# Patient Record
Sex: Male | Born: 1945 | Race: Black or African American | Hispanic: No | Smoking: Never smoker
Health system: Southern US, Community
[De-identification: ages and names within clinical notes are randomized; demographics above are authoritative.]

## PROBLEM LIST (undated history)

## (undated) DIAGNOSIS — Z77090 Contact with and (suspected) exposure to asbestos: Secondary | ICD-10-CM

## (undated) DIAGNOSIS — N529 Male erectile dysfunction, unspecified: Secondary | ICD-10-CM

## (undated) DIAGNOSIS — N400 Enlarged prostate without lower urinary tract symptoms: Secondary | ICD-10-CM

## (undated) DIAGNOSIS — C61 Malignant neoplasm of prostate: Secondary | ICD-10-CM

## (undated) DIAGNOSIS — Z77098 Contact with and (suspected) exposure to other hazardous, chiefly nonmedicinal, chemicals: Secondary | ICD-10-CM

## (undated) DIAGNOSIS — K4091 Unilateral inguinal hernia, without obstruction or gangrene, recurrent: Secondary | ICD-10-CM

## (undated) DIAGNOSIS — E7800 Pure hypercholesterolemia, unspecified: Secondary | ICD-10-CM

## (undated) DIAGNOSIS — Z8739 Personal history of other diseases of the musculoskeletal system and connective tissue: Secondary | ICD-10-CM

## (undated) DIAGNOSIS — Z7739 Contact with and (suspected) exposure to other war theater: Secondary | ICD-10-CM

## (undated) HISTORY — DX: Malignant neoplasm of prostate: C61

## (undated) HISTORY — DX: Pure hypercholesterolemia, unspecified: E78.00

## (undated) HISTORY — DX: Male erectile dysfunction, unspecified: N52.9

## (undated) HISTORY — DX: Benign prostatic hyperplasia without lower urinary tract symptoms: N40.0

## (undated) HISTORY — DX: Personal history of other diseases of the musculoskeletal system and connective tissue: Z87.39

## (undated) HISTORY — DX: Contact with and (suspected) exposure to other hazardous, chiefly nonmedicinal, chemicals: Z77.098

## (undated) HISTORY — DX: Contact with and (suspected) exposure to other war theater: Z77.39

## (undated) HISTORY — DX: Unilateral inguinal hernia, without obstruction or gangrene, recurrent: K40.91

## (undated) HISTORY — DX: Contact with and (suspected) exposure to asbestos: Z77.090

---

## 2000-04-10 ENCOUNTER — Ambulatory Visit (HOSPITAL_COMMUNITY): Admission: RE | Admit: 2000-04-10 | Discharge: 2000-04-10 | Payer: Self-pay | Admitting: *Deleted

## 2001-06-16 ENCOUNTER — Encounter (INDEPENDENT_AMBULATORY_CARE_PROVIDER_SITE_OTHER): Payer: Self-pay

## 2001-06-16 ENCOUNTER — Other Ambulatory Visit: Admission: RE | Admit: 2001-06-16 | Discharge: 2001-06-16 | Payer: Self-pay | Admitting: Urology

## 2004-11-08 ENCOUNTER — Ambulatory Visit (HOSPITAL_COMMUNITY): Admission: RE | Admit: 2004-11-08 | Discharge: 2004-11-08 | Payer: Self-pay | Admitting: Internal Medicine

## 2007-05-31 ENCOUNTER — Ambulatory Visit (HOSPITAL_COMMUNITY): Admission: RE | Admit: 2007-05-31 | Discharge: 2007-05-31 | Payer: Self-pay | Admitting: *Deleted

## 2011-01-07 NOTE — Op Note (Signed)
Ryan Pope, Ryan Pope              ACCOUNT NO.:  [id number]   MEDICAL RECORD NO.:  [id number]          PATIENT TYPE:  AMB   LOCATION:  ENDO                         FACILITY:  [facility]   PHYSICIAN:  Georgiana Spinner, M.D.    DATE OF BIRTH:  10-02-1945   DATE OF PROCEDURE:  05/31/2007  DATE OF DISCHARGE:                               OPERATIVE REPORT   Dr. working Karleen Hampshire W first turned AVMs [id number].   PROCEDURE:  Colonoscopy.   INDICATIONS:  Colon polyps.   ANESTHESIA:  Demerol 100 mg, Versed 10 mg.   PROCEDURE:  With the patient mildly sedated in the left lateral  decubitus position, a rectal exam was performed which was unremarkable  to my examination.  Subsequently the Pentax videoscopic colonoscope was  inserted the rectum and passed under direct vision to cecum identified  by ileocecal valve and appendiceal orifice both of which were  photographed.  From this point the colonoscope was slowly withdrawn  taking circumferential views of colonic mucosa stopping only in the  rectum which appeared normal on direct and showed hemorrhoids on  retroflexed view.  The endoscope was straightened and withdrawn.  The  patient's vital signs, pulse oximeter remained stable.  The patient  tolerated procedure well without apparent complications.   FINDINGS:  Some diverticulosis of sigmoid colon.  Internal hemorrhoids,  otherwise unremarkable exam.   PLAN:  Repeat examination five years.           ______________________________  Georgiana Spinner, M.D.     GMO/MEDQ  D:  05/31/2007  T:  05/31/2007  Job:  [id number]

## 2011-01-10 NOTE — Procedures (Signed)
[facility]  Patient:    Ryan Pope, Ryan Pope                     MRN: [id number] Proc. Date: 04/10/00 Adm. Date:  [id number] Attending:  Sabino Gasser                           Procedure Report  PROCEDURE:  Colonoscopy.  INDICATIONS FOR PROCEDURE:  Follow-up of colon polyp significantly found 3.5 years ago.  ANESTHESIA:  Demerol 70 mg, Versed 5 mg was given intravenously in divided dose.  DESCRIPTION OF PROCEDURE:  With the patient mildly sedated in the left lateral decubitus position, the Olympus videoscopic colonoscope was inserted in the rectum after a normal rectal exam and passed under direct vision to the cecum. The cecum was identified by the ileocecal valve and appendiceal orifice both of which were photographed. We entered into the terminal ileum which also appeared normal and was photographed. From this point, the colonoscope was slowly withdrawn taking circumferential views of the entire colonic mucosa, stopping only in sigmoid colon where some rare diverticula were seen and photographed. This was done until we reached the rectum which appeared normal on direct and retroflexed view. The endoscope was straightened and withdrawn. The patients vital signs and pulse oximeter remained stable.  The patient tolerated the procedure well without apparent complications.  FINDINGS:  Rare diverticulum of the sigmoid colon, otherwise unremarkable colonoscopic examination.  PLAN:  Repeat examination in 5 years. DD:  04/10/00 TD:  04/11/00 Job: [id number]

## 2011-07-08 ENCOUNTER — Other Ambulatory Visit: Payer: Self-pay | Admitting: Internal Medicine

## 2011-07-08 DIAGNOSIS — Z136 Encounter for screening for cardiovascular disorders: Secondary | ICD-10-CM

## 2011-07-10 ENCOUNTER — Ambulatory Visit
Admission: RE | Admit: 2011-07-10 | Discharge: 2011-07-10 | Disposition: A | Discharge status: Home / Self Care | Payer: Medicare Other | Source: Ambulatory Visit | Attending: Internal Medicine | Admitting: Internal Medicine

## 2011-07-10 ENCOUNTER — Ambulatory Visit: Payer: Self-pay

## 2011-07-10 DIAGNOSIS — Z136 Encounter for screening for cardiovascular disorders: Secondary | ICD-10-CM

## 2014-09-20 DIAGNOSIS — F39 Unspecified mood [affective] disorder: Secondary | ICD-10-CM | POA: Diagnosis not present

## 2014-11-01 DIAGNOSIS — R972 Elevated prostate specific antigen [PSA]: Secondary | ICD-10-CM | POA: Diagnosis not present

## 2014-11-08 DIAGNOSIS — N5201 Erectile dysfunction due to arterial insufficiency: Secondary | ICD-10-CM | POA: Diagnosis not present

## 2014-11-08 DIAGNOSIS — R972 Elevated prostate specific antigen [PSA]: Secondary | ICD-10-CM | POA: Diagnosis not present

## 2014-11-08 DIAGNOSIS — R351 Nocturia: Secondary | ICD-10-CM | POA: Diagnosis not present

## 2014-11-08 DIAGNOSIS — C61 Malignant neoplasm of prostate: Secondary | ICD-10-CM | POA: Diagnosis not present

## 2015-02-05 DIAGNOSIS — C61 Malignant neoplasm of prostate: Secondary | ICD-10-CM | POA: Diagnosis not present

## 2015-02-12 DIAGNOSIS — N400 Enlarged prostate without lower urinary tract symptoms: Secondary | ICD-10-CM | POA: Diagnosis not present

## 2015-02-12 DIAGNOSIS — C61 Malignant neoplasm of prostate: Secondary | ICD-10-CM | POA: Diagnosis not present

## 2015-02-12 DIAGNOSIS — R351 Nocturia: Secondary | ICD-10-CM | POA: Diagnosis not present

## 2015-02-12 DIAGNOSIS — K4090 Unilateral inguinal hernia, without obstruction or gangrene, not specified as recurrent: Secondary | ICD-10-CM | POA: Diagnosis not present

## 2015-04-09 DIAGNOSIS — R972 Elevated prostate specific antigen [PSA]: Secondary | ICD-10-CM | POA: Diagnosis not present

## 2015-04-09 DIAGNOSIS — C61 Malignant neoplasm of prostate: Secondary | ICD-10-CM | POA: Diagnosis not present

## 2015-09-05 DIAGNOSIS — Z0000 Encounter for general adult medical examination without abnormal findings: Secondary | ICD-10-CM | POA: Diagnosis not present

## 2015-09-05 DIAGNOSIS — Z23 Encounter for immunization: Secondary | ICD-10-CM | POA: Diagnosis not present

## 2015-09-05 DIAGNOSIS — Z125 Encounter for screening for malignant neoplasm of prostate: Secondary | ICD-10-CM | POA: Diagnosis not present

## 2015-09-05 DIAGNOSIS — E7800 Pure hypercholesterolemia, unspecified: Secondary | ICD-10-CM | POA: Diagnosis not present

## 2015-09-11 DIAGNOSIS — D72819 Decreased white blood cell count, unspecified: Secondary | ICD-10-CM | POA: Diagnosis not present

## 2015-09-11 DIAGNOSIS — D696 Thrombocytopenia, unspecified: Secondary | ICD-10-CM | POA: Diagnosis not present

## 2015-09-11 DIAGNOSIS — C61 Malignant neoplasm of prostate: Secondary | ICD-10-CM | POA: Diagnosis not present

## 2015-09-11 DIAGNOSIS — R413 Other amnesia: Secondary | ICD-10-CM | POA: Diagnosis not present

## 2015-09-11 DIAGNOSIS — Z23 Encounter for immunization: Secondary | ICD-10-CM | POA: Diagnosis not present

## 2015-09-11 DIAGNOSIS — R634 Abnormal weight loss: Secondary | ICD-10-CM | POA: Diagnosis not present

## 2015-09-25 ENCOUNTER — Encounter: Payer: Self-pay | Admitting: Neurology

## 2015-09-25 ENCOUNTER — Ambulatory Visit (INDEPENDENT_AMBULATORY_CARE_PROVIDER_SITE_OTHER): Payer: Medicare Other | Admitting: Neurology

## 2015-09-25 ENCOUNTER — Other Ambulatory Visit: Payer: Self-pay | Admitting: Neurology

## 2015-09-25 VITALS — BP 122/64 | HR 72 | Resp 16 | Ht 67.0 in | Wt 147.0 lb

## 2015-09-25 DIAGNOSIS — F0390 Unspecified dementia without behavioral disturbance: Secondary | ICD-10-CM

## 2015-09-25 NOTE — Patient Instructions (Signed)
You have complaints of memory loss: memory loss or changes in cognitive function can have many reasons and does not always mean you have dementia. Conditions that can contribute to subjective or objective memory loss include: depression, stress, poor sleep from insomnia or sleep apnea, dehydration, fluctuation in blood sugar values, thyroid or electrolyte dysfunction and certain vitamin deficiencies. Dementia can be caused by stroke, brain atherosclerosis or brain vascular disease due to vascular risk factors (smoking, high blood pressure, high cholesterol, obesity and uncontrolled diabetes), certain degenerative brain disorders (including Parkinson's disease and Multiple sclerosis) and by Alzheimer's disease or other, more rare and sometimes hereditary causes. We will do some additional testing: blood work (which has been done recently already, so we will request results) and we will do a brain scan. We will not start medication as yet, but we may consider medicine soon. We will also request a formal cognitive test called neuropsychological evaluation which is done by a licensed neuropsychologist. We will make a referral in that regard. We will call you with brain scan test results and monitor your symptoms.   As far as your driving, please stick with local roads and daylight driving and avoid highways. Have one of your children observe your driving please.   Drink more water. Exercise regularly.

## 2015-09-25 NOTE — Progress Notes (Addendum)
Subjective:    Patient ID: Ryan Pope is a 70 y.o. male.  HPI     Star Age, MD, PhD [facility] [address] [place], [place] [address]  Dear Dr. Shelia Media,   I saw your patient, Ryan Pope, upon your kind request, in my neurologic clinic today for initial consultation of his memory loss. The patient is accompanied by his daughter today. As you know, Ryan Pope is a 70 year old right-handed gentleman with an underlying medical history of hyperlipidemia, prostate cancer, low back pain, cataracts, thrombocytopenia, weight loss, hearing loss, diverticulosis, gastric ulcer, osteoarthritis, BPH and impaired fasting glucose, who reports memory loss for the past 12 months with mild progression noted.  I reviewed your office note from 09/11/2015, which you kindly included. He is widowed. He lives alone, has lived alone, since his daughter moved out in 52. His wife died of breast cancer in 31. He has 3 sons and 1 daughter his oldest son and middle son living [place], his daughter lives locally as well, his youngest son lives in [place]. You ordered a B12 level and TSH at the time. His MMSE was 27 out of 30 in your office. We will request test results. He has been driving but has had some issues lately with getting lost even on familiar roads. He denies any mood disorder, including hallucinations, delusions, sleep problems, anxiety. He has no overt family history of dementia with the exception of a sister age 40 with memory loss. He has 1 brother and 3 sisters. His mother died at 67 with lung cancer, she was a smoker. His father lived to be in his 29s. He [place] and his parents separated when he was 70 years old. He drinks alcohol occasionally, not daily, and has never been a heavy drinker. He has never been a smoker. 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 He has a high school education.  He has not had any problems with his finances. He reports sleeping well at night but does not keep a very good schedule, often watches TV late at night. His rise time is usually around 7:30 AM.  He was on medication for his prostate cancer but per his daughter's report this was stopped because of potential cognitive side effects. He denies any TIA like symptoms or stroke in the past.  09/25/2015: 2:22 PM: I received blood test results from your office, I reviewed blood work from 09/05/2015: CBC without differential was unremarkable, CMP from 09/05/2015 was unremarkable, CK level CXX, lipid panel showed total cholesterol of 185, HDL 57, LDL 113, triglycerides 77, urinalysis negative, PSA elevated at 8.4. Blood test from 09/11/2015 showed normal B12 at 780, TSH normal at 3.66.  His Past Medical History Is Significant For: Past Medical History  Diagnosis Date  . Prostate cancer ([place])   . Hypercholesteremia   . ED (erectile dysfunction)   . BPH (benign prostatic hyperplasia)   . Asbestos exposure   . History of low back pain   . History of agent Orange exposure   . Recurrent inguinal hernia     His Past Surgical History Is Significant For: No past surgical history on file.  His Family History Is Significant For: Family History  Problem Relation Age of Onset  . Lung cancer Mother   . Prostate cancer Maternal Uncle     His Social History Is Significant For: Social History   Social History  .  Marital Status: Widowed    Spouse Name: N/A  . Number of Children: 4  . Years of Education: HS   Occupational History  . Retired     Social History Main Topics  . Smoking status: Never Smoker   . Smokeless tobacco: None  . Alcohol Use: No  . Drug Use: No  . Sexual Activity: Not Asked   Other Topics Concern  . None   Social History Narrative   Denies caffeine use     His Allergies Are:  Allergies  Allergen Reactions  . Zoloft [Sertraline Hcl]   :   His Current Medications  Are:  Outpatient Encounter Prescriptions as of 09/25/2015  Medication Sig  . bisacodyl (DULCOLAX) 5 MG EC tablet Take 5 mg by mouth daily as needed for moderate constipation.  . cholecalciferol (VITAMIN D) 1000 units tablet Take 1,000 Units by mouth daily.  . cycloSPORINE (RESTASIS) 0.05 % ophthalmic emulsion 1 drop 2 (two) times daily.  Marland Kitchen loratadine (CLARITIN) 10 MG tablet Take 10 mg by mouth daily.  . Multiple Vitamin (MULTIVITAMIN) capsule Take 1 capsule by mouth daily.  . sildenafil (VIAGRA) 100 MG tablet Take 100 mg by mouth daily as needed for erectile dysfunction. Reported on 09/25/2015   No facility-administered encounter medications on file as of 09/25/2015.  :  Review of Systems:  Out of a complete 14 point review of systems, all are reviewed and negative with the exception of these symptoms as listed below:   Review of Systems  HENT: Positive for hearing loss.   Neurological:       Patient feels like his memory loss has been a slow progression.   Psychiatric/Behavioral: Positive for confusion.    Objective:  Neurologic Exam  Physical Exam Physical Examination:   Filed Vitals:   09/25/15 0858  BP: 122/64  Pulse: 72  Resp: 16   General Examination: The patient is a very pleasant 70 y.o. male in no acute distress. He is calm and cooperative with the exam. He denies Auditory Hallucinations and Visual Hallucinations. He is very well groomed and situated in a chair.   HEENT: Normocephalic, atraumatic, pupils are equal, round and reactive to light and accommodation. Funduscopic exam is normal with sharp disc margins noted. He has mild bilateral cataracts. Extraocular tracking shows no saccadic breakdown without nystagmus noted. Hearing is intact. Face is symmetric with no facial masking and normal facial sensation. There is no lip, neck or jaw tremor. Neck is not rigid with intact passive ROM. There are no carotid bruits on auscultation. Oropharynx exam reveals mild mouth  dryness. No significant airway crowding is noted. Mallampati is class II. Tongue protrudes centrally and palate elevates symmetrically.    Chest: is clear to auscultation without wheezing, rhonchi or crackles noted.  Heart: sounds are regular and normal without murmurs, rubs or gallops noted.   Abdomen: is soft, non-tender and non-distended with normal bowel sounds appreciated on auscultation.  Extremities: There is no pitting edema in the distal lower extremities bilaterally. Pedal pulses are intact.   Skin: is warm and dry with no trophic changes noted.   Musculoskeletal: exam reveals no obvious joint deformities, tenderness or joint swelling or erythema.   Neurologically:  Mental status: The patient is awake and alert, paying good  attention. He is unable to partially provide the history. His daugher, Ryan Pope provides details. He is oriented to: person, time/date, situation, day of week, month of year and year. His memory, attention, language and knowledge are impaired mildly. There  is no aphasia, agnosia, apraxia or anomia. There is a mild degree of bradyphrenia. Speech is mildly hypophonic with no dysarthria noted. Mood is congruent and affect is normal.  On 09/25/2015: His MMSE (Mini-Mental state exam) score is 20/30.  CDT (Clock Drawing Test) score is 3/4.  AFT (Animal Fluency Test) score is 8.  Geriatric Depression Scale Score is 3.    Cranial nerves are as described above under HEENT exam. In addition, shoulder shrug is normal with equal shoulder height noted.  Motor exam: Normal bulk, and strength for age is noted. Tone is not rigid with absence of cogwheeling. There is no tremor or rebound, reflexes are 1-2+ throughout, toes and downgoing. Findings motor skills are intact.   Cerebellar testing shows no dysmetria or intention tremor on finger to nose testing. Heel to shin is unremarkable. There is no truncal or gait ataxia.   Sensory exam is intact to light touch, pinprick,  vibration, temperature sense in the upper and lower extremities.   Gait, station and balance: He stands up from the seated position with no difficulty and  posture is normal, stance is narrow based. Tandem walk is normal. Balance is not impaired. Romberg is negative.   Assessment and Plan:   In summary, Ryan Pope is a very pleasant 70 y.o.-year old male with an underlying medical history of hyperlipidemia, prostate cancer, low back pain, cataracts, thrombocytopenia, weight loss, hearing loss, diverticulosis, gastric ulcer, osteoarthritis, BPH and impaired fasting glucose, who has had memory loss for the past 12 months. 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 He has not very many risk factors for vascular disease no strong family history of Alzheimer's disease as reported.  He had recent blood work and we will request test results. I would like to proceed with a brain MRI without contrast and formal neuropsychological evaluation with a license neuropsychologist. He is in agreement, I will make a referral in that regard and we will call him with his brain MRI results. We talked about the diagnosis of memory loss and dementia, its prognosis and treatment options. Implications of diagnosis explained at length with the patient and caregiver.. We talked about medical treatments and non-pharmacological approaches. We talked about maintaining a healthy lifestyle in general and staying active mentally and physically. I encouraged the patient to eat healthy, exercise daily and keep well hydrated, to keep a scheduled bedtime and wake time routine, to not skip any meals and eat healthy snacks in between meals and to have protein with every meal. I stressed the importance of regular exercise, within of course the patient's own mobility limitations. I encouraged the patient to keep up with current events by reading the news paper or watching the news and to  do word puzzles, or if feasible, to go on [email or web address].   We talked about potentially utilizing memory medication in the near future. For now, we will await test results and I will see him back soon. I answered all her questions today and the patient and his daughter, Ryan Pope, were in agreement with the above outlined plan.    Thank you very much for allowing me to participate in the care of this nice patient. If I can be of any further assistance to you please do not hesitate to call me at [phone].  Sincerely,   Star Age, MD, PhD

## 2015-10-03 ENCOUNTER — Encounter: Payer: Self-pay | Admitting: Neurology

## 2015-10-08 DIAGNOSIS — C61 Malignant neoplasm of prostate: Secondary | ICD-10-CM | POA: Diagnosis not present

## 2015-10-10 ENCOUNTER — Ambulatory Visit
Admission: RE | Admit: 2015-10-10 | Discharge: 2015-10-10 | Disposition: A | Discharge status: Home / Self Care | Payer: 59 | Source: Ambulatory Visit | Attending: Neurology | Admitting: Neurology

## 2015-10-10 DIAGNOSIS — F0390 Unspecified dementia without behavioral disturbance: Secondary | ICD-10-CM | POA: Diagnosis not present

## 2015-10-10 DIAGNOSIS — Z01818 Encounter for other preprocedural examination: Secondary | ICD-10-CM | POA: Diagnosis not present

## 2015-10-11 ENCOUNTER — Telehealth: Payer: Self-pay

## 2015-10-11 NOTE — Telephone Encounter (Signed)
Called pt to discuss MRI results. No answer, left a message asking him to call back.

## 2015-10-11 NOTE — Telephone Encounter (Signed)
Spoke to pt's daughter, Vedia Pereyra, per [place]. I advised pt's daughter of Dr. Guadelupe Sabin result note as detailed in this message. Pt's daughter had no questions or concerns at this time and verbalized understanding.

## 2015-10-11 NOTE — Telephone Encounter (Signed)
Pt returned Kristen's call °

## 2015-10-11 NOTE — Progress Notes (Signed)
Quick Note:  Please call patient regarding the recent brain MRI: The brain scan showed a normal structure of the brain and mild volume loss which we call atrophy. There were changes in the deeper structures of the brain, which we call white matter changes or microvascular changes. These were reported as very mild or minimal in His case. These are tiny white spots, that occur with time and are seen in a variety of conditions, including with normal aging, chronic hypertension, chronic headaches, especially migraine HAs, chronic diabetes, chronic hyperlipidemia. These are not strokes and no mass or lesion was seen which is reassuring. Again, there were no acute findings, such as a stroke, or mass or blood products. No further action is required on this test at this time, other than re-enforcing the importance of good blood pressure control, good cholesterol control, good blood sugar control, and weight management. Please remind patient to keep any upcoming appointments or tests and to call us with any interim questions, concerns, problems or updates. Thanks,  Star Age, MD, PhD    ______

## 2015-10-11 NOTE — Telephone Encounter (Signed)
-----   Message from Star Age, MD sent at 10/11/2015  1:04 PM EST ----- Please call patient regarding the recent brain MRI: The brain scan showed a normal structure of the brain and mild volume loss which we call atrophy. There were changes in the deeper structures of the brain, which we call white matter changes or microvascular changes. These were reported as very mild or minimal in His case. These are tiny white spots, that occur with time and are seen in a variety of conditions, including with normal aging, chronic hypertension, chronic headaches, especially migraine HAs, chronic diabetes, chronic hyperlipidemia. These are not strokes and no mass or lesion was seen which is reassuring. Again, there were no acute findings, such as a stroke, or mass or blood products. No further action is required on this test at this time, other than re-enforcing the importance of good blood pressure control, good cholesterol control, good blood sugar control, and weight management. Please remind patient to keep any upcoming appointments or tests and to call us with any interim questions, concerns, problems or updates. Thanks,  Star Age, MD, PhD

## 2015-11-21 DIAGNOSIS — N400 Enlarged prostate without lower urinary tract symptoms: Secondary | ICD-10-CM | POA: Diagnosis not present

## 2015-11-21 DIAGNOSIS — R972 Elevated prostate specific antigen [PSA]: Secondary | ICD-10-CM | POA: Diagnosis not present

## 2015-11-21 DIAGNOSIS — R32 Unspecified urinary incontinence: Secondary | ICD-10-CM | POA: Diagnosis not present

## 2015-11-21 DIAGNOSIS — Z0000 Encounter for general adult medical examination without abnormal findings: Secondary | ICD-10-CM | POA: Diagnosis not present

## 2015-11-21 DIAGNOSIS — C61 Malignant neoplasm of prostate: Secondary | ICD-10-CM | POA: Diagnosis not present

## 2015-12-06 DIAGNOSIS — Z8546 Personal history of malignant neoplasm of prostate: Secondary | ICD-10-CM | POA: Diagnosis not present

## 2015-12-06 DIAGNOSIS — R634 Abnormal weight loss: Secondary | ICD-10-CM | POA: Diagnosis not present

## 2015-12-06 DIAGNOSIS — R413 Other amnesia: Secondary | ICD-10-CM | POA: Diagnosis not present

## 2015-12-27 ENCOUNTER — Ambulatory Visit: Payer: 59 | Attending: Psychology | Admitting: Psychology

## 2015-12-27 ENCOUNTER — Encounter: Payer: Self-pay | Admitting: Psychology

## 2015-12-27 DIAGNOSIS — F0390 Unspecified dementia without behavioral disturbance: Secondary | ICD-10-CM | POA: Insufficient documentation

## 2015-12-27 NOTE — Progress Notes (Signed)
[facility]  [address]   Telephone [phone] [address] Fax [phone] [place], [place] [address]  Initial Contact Note  Name:  Ryan Pope Date of Birth; 1946/01/08 MRN:  [id number] Date:  12/27/2015  Ryan Pope is an 70 y.o. male who was referred for neuropsychological evaluation by Star Age, MD due to his two year history of progressive memory decline.   A total of 5 hours was spent today reviewing medical records, interviewing (CPT [phone]) Ryan Pope and his daughter Ryan Pope, administering and scoring neurocognitive tests (CPT [phone] & [phone]) and preparing a written report.  Diagnostic Impression: Dementia without behavioral disturbance [F03.90]  There were no concerns expressed or behaviors displayed by Ryan Pope that would require immediate attention.  A full report will follow once the evaluation results have been discussed with Mr. Luecht and his daughter. His next appointment is scheduled for 01/02/16.   Jamey Ripa, Ph.D Licensed Psychologist 12/27/2015

## 2016-01-02 ENCOUNTER — Ambulatory Visit (INDEPENDENT_AMBULATORY_CARE_PROVIDER_SITE_OTHER): Payer: 59 | Admitting: Psychology

## 2016-01-02 DIAGNOSIS — F0390 Unspecified dementia without behavioral disturbance: Secondary | ICD-10-CM

## 2016-01-03 ENCOUNTER — Encounter: Payer: Self-pay | Admitting: Psychology

## 2016-01-03 NOTE — Progress Notes (Signed)
[facility]  [address]   Telephone [phone] [address] Fax [phone] [place], [place] [address]    NEUROPSYCHOLOGICAL EVALUATION  *CONFIDENTIAL* This report should not be released without the consent of the client  Name:   Ryan Pope   Date of Birth:  [birth year] [facility] MR#:  [id number] Date of Evaluation: 12/27/15  Reason for Referral Ryan Pope is a 70 year old right-handed man who was referred by Star Age, MD of [facility] for an evaluation of his cognitive functioning. Ryan Pope has displayed signs of memory loss over the past two years.  A brain MRI on 10/10/15 showed mild anterior temporal atrophy and minimal periventricular and subcortical foci of non-specific gliosis.  Sources of Information Electronic medical records from the [facility] were reviewed. Ryan Pope and his daughter, Ryan Pope, were interviewed.    Chief Complaints Ryan Pope reported first noticing a decline in his memory about two years ago. He was not aware of any changes in his health, medication usage, mood, level of life stress or social situation coincident with the onset of his memory loss. He gave recent examples of forgetting why he entered a room, not keeping up with the date, quickly forgetting what he has just read and taking longer to pay his bills. He did not report any problems performing his basic and complex activities of daily living. His daughter agreed that his memory has declined over the past two years. She reported that he has sometimes repeated what he had recently said and has often misplaced personal items. His daughter, who has been visiting him about once a week, reported that he continues to live alone without difficulty. He rarely cooks. He reported that he has been able to remember to take his one medication by routine every morning. Neither he nor his daughter was aware of him having problems with bill  paying or financial management. He reported that he has been driving only to local and familiar destinations without difficulty. His daughter stated her belief that he is a competent driver based on riding with him about once a week.   His only other complaint was intermittent left shoulder pain. He denied problems with gait, eye-hand coordination, balance, limb strength, sleep, daytime alertness, vision, swallowing, speech, appetite, smell or taste.   He denied experiencing emotional distress other than worry about his memory. He specifically denied sad mood, apathy, anhedonia, loss of interests, feelings of worthlessness or hopelessness, passive or active suicidal thoughts, mania, hallucinations and delusions. He did not report any ongoing stressors or recent negative life events. His daughter has not observed any recent changes in his mood, personality or habits. He has not exhibited confusion, wandering, bizarre behavior, poor judgment, loss of social comportment or unsafe behavior.  Background Ryan Pope has lived alone since his daughter moved out in 13. His wife died in 48. He has three sons and one daughter, three of whom live locally.  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  He reported that he was graduated from high school as a good Ship broker without attentional or learning problems. He attended one year at [employer] but dropped out to join [employer].  He served two years in the [place] during the [place] War in a support role without combat experience.  His past medical history was notable for benign prostatic hyperplasia, hearing loss, hyperlipidemia, osteoarthritis  and prostate cancer.   His current medications include loratadine, sildenafil as needed and vitamin supplements.  He did not report history of unusual childhood illnesses or developmental delays. He reported no history of head  injury, loss of consciousness, seizure activity or stroke-like symptoms. He reported that he was exposed to [employer] while serving in the [place] [employer].   He reported occasional alcohol use without past history of abuse. He reported that he has never used illicit drugs or tobacco products.   He was not aware of any family history of memory disorder or dementia.   He reported no history of serious emotional difficulties, use of psychiatric medications or mental health contacts.  Observations He appeared as a slender, appropriately dressed and groomed man in no apparent distress. He was pleasant with a soft-spoken manner. He maintained good eye contact and responded to all questions. No problems were evident for speech articulation, prosody, word finding, word selection, message coherence or language comprehension. His affect appeared within a wide range and without lability. He did not display signs of emotional distress. He had difficulty recalling when in time some past life events had occurred (e.g., year he retired). His thought processes were coherent and organized without loose associations, verbal perseverations or flight of ideas. His thought content was devoid of unusual or bizarre ideas.  Evaluation Procedures In addition to review of medical records and interviews, the following tests or questionnaires were administered:  Animal Naming Test [employer] test Clock Drawing Test      Controlled Oral Word Association Test Geriatric Depression Scale (short form)      Trail Making A & B     Rey Complex Figure: copy Wechsler Adult Intelligence Scale-IV:  Music therapist, Coding, Digit Span, Similarities      Wechsler Memory Scale- IV: Older Adult battery & Symbol Span      Wide Range Achievement Test-4: word reading  Assessment Results Test results were considered to be a valid representation of his current cognitive functioning. He appeared alert and focused. He did not report or  display problems with vision (he wore his eyeglasses), hearing or motor control. He had no apparent problems understanding task instructions. He seemed to put forth optimal effort.   His test scores were corrected to reflect norms for his age and, whenever possible, his gender, race (i.e., African-American) and educational level (i.e., 13 years). A list of his test scores can be found at the end of this report.  Overall, neuropsychological testing indicated global and serious cognitive compromise. He was unable to count down from 20 to 1 without error, copy a spatially complex design, perform a visual-motor test requiring mental tracking and set shifting or repeat the months of the year backwards. He performed within the Severely impaired range (i.e. <1st percentile) on measures of visual processing speed, auditory working memory, immediate memory, delayed memory and visual-spatial assembly. He performed within the Moderately to Severely impaired range (i.e., 1st - 2nd percentile) on tests of visual scanning/simple sequencing and phonemic fluency. He performed within the Borderline to Mildly impaired range (i.e., 2nd - 8th percentile) on tests of category fluency, confrontational naming, constructional praxis (i.e., clock drawing) and abstract verbal reasoning. His only scores within the Normal range were on measures of immediate auditory attention span and word reading.    His score of 1/15 on the Geriatric Depression Scale (short form) did not indicate depression.  Summary Jeraldo Vivier is a 70 year-old man with an approximate two year history of  progressive memory difficulties. Neuropsychological testing indicated serious cognitive compromise across multiple cognitive domains. There were no indications of psychiatric disorder or behavioral disturbance.   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 Use of  medication that might possibly slow his cognitive decline could be considered, if medically appropriate.  2. Based on his daughter's input, no changes in his living situation or lifestyle appear necessary at this point in time. He was encouraged to go ahead with his idea of selling two properties and several cars that he owns as a means to simplify his life. Given his cognitive decline, he was advised to include a family member in the sales process.    3. He was advised to nominate someone to be Power of Attorney over his health care and financial decision making.   4. His cognitive and adaptive functioning should be tracked at regular intervals. Assuming that his cognitive decline is of a non-reversible nature, further cognitive assessment could be accomplished at a screening level (e.g., MOCA) as more in-depth neurocognitive testing would likely be of limited clinical value.  The results from this evaluation were discussed with Mr. Orejel and his daughter on 01/02/16.He had good insight into his difficulties on cognitive testing. He wondered whether his exposure to [place] during the [place] War could be a factor in his cognitive decline. His daughter questioned whether his cognitive decline could be related a chemotherapy agent he was taking two years ago for prostate cancer. Both seemed open to the possibility that he has a dementing illness.    I have appreciated the opportunity to evaluate Mr. Honts. Please feel free to contact me with any comments or questions.    ______________________ Jamey Ripa, Ph.D Licensed Psychologist       . ADDENDUM-NEUROPSYCHOLOGICAL TEST RESULTS  Animal Naming Test Score= 8 3rd (adjusted for age, gender, race and educational level)   [place] Naming Test Score=38/60 5th (adjusted for age, gender, race and educational level)   Clock Drawing Test Score=6/10 Two attempts. Best drawing was mildly impaired as he could not figure out how to  draw the hands   Controlled Oral Word Association Test Score= 12 words/0 repetitions 1st  (adjusted for age, gender, race and educational level)   Rey Complex Figure: copy         Unable to complete,  gave up after 37m 25s   Trails A Score=  154s  1e 1st (adjusted for age, gender, race and educational level)  Trails B Score=  N/A Unable to complete   Wechsler Adult Intelligence Scale-IV  Subtest Scaled Score Percentile  Block Design   2 <1st     Similarities   4   2nd    Digit Span  Forward                Backward                Sequencing   3   7   6    2    1st           16th    9th     <1st      Coding     2 <1st      Wechsler Memory Scale-IV Older Adult Battery Index Index Score Percentile  Immediate Memory   49 <1st      Auditory Memory   53 <1st      Visual Memory   40 <  1st       Delayed Memory     49 <1st      Symbol Span SS=1 <1st       Wide Range Achievement Test-4 Subtest  Raw score Standard score Percentile  Word Reading 53/70     91 27th

## 2016-01-22 ENCOUNTER — Ambulatory Visit: Payer: Medicare Other | Admitting: Neurology

## 2016-05-19 DIAGNOSIS — K4090 Unilateral inguinal hernia, without obstruction or gangrene, not specified as recurrent: Secondary | ICD-10-CM | POA: Diagnosis not present

## 2016-05-19 DIAGNOSIS — Z23 Encounter for immunization: Secondary | ICD-10-CM | POA: Diagnosis not present

## 2016-05-28 DIAGNOSIS — R972 Elevated prostate specific antigen [PSA]: Secondary | ICD-10-CM | POA: Diagnosis not present

## 2016-06-04 DIAGNOSIS — C61 Malignant neoplasm of prostate: Secondary | ICD-10-CM | POA: Diagnosis not present

## 2016-06-04 DIAGNOSIS — R972 Elevated prostate specific antigen [PSA]: Secondary | ICD-10-CM | POA: Diagnosis not present

## 2016-06-25 DIAGNOSIS — K4020 Bilateral inguinal hernia, without obstruction or gangrene, not specified as recurrent: Secondary | ICD-10-CM | POA: Diagnosis not present

## 2016-08-08 DIAGNOSIS — K4020 Bilateral inguinal hernia, without obstruction or gangrene, not specified as recurrent: Secondary | ICD-10-CM | POA: Diagnosis not present

## 2018-09-24 ENCOUNTER — Emergency Department (HOSPITAL_COMMUNITY)
Admission: EM | Admit: 2018-09-24 | Discharge: 2018-09-24 | Disposition: A | Discharge status: Home / Self Care | Payer: Non-veteran care | Attending: Emergency Medicine | Admitting: Emergency Medicine

## 2018-09-24 ENCOUNTER — Other Ambulatory Visit: Payer: Self-pay

## 2018-09-24 DIAGNOSIS — F0390 Unspecified dementia without behavioral disturbance: Secondary | ICD-10-CM | POA: Insufficient documentation

## 2018-09-24 DIAGNOSIS — Z79899 Other long term (current) drug therapy: Secondary | ICD-10-CM | POA: Diagnosis not present

## 2018-09-24 LAB — BASIC METABOLIC PANEL
ANION GAP: 8 (ref 5–15)
BUN: 14 mg/dL (ref 8–23)
CALCIUM: 9 mg/dL (ref 8.9–10.3)
CHLORIDE: 106 mmol/L (ref 98–111)
CO2: 29 mmol/L (ref 22–32)
Creatinine, Ser: 1.22 mg/dL (ref 0.61–1.24)
GFR calc non Af Amer: 58 mL/min — ABNORMAL LOW (ref >60)
Glucose, Bld: 69 mg/dL — ABNORMAL LOW (ref 70–99)
Potassium: 3.9 mmol/L (ref 3.5–5.1)
SODIUM: 143 mmol/L (ref 135–145)

## 2018-09-24 LAB — CBC WITH DIFFERENTIAL/PLATELET
Abs Immature Granulocytes: 0 10*3/uL (ref 0.00–0.07)
BASOS ABS: 0 10*3/uL (ref 0.0–0.1)
Basophils Relative: 1 %
Eosinophils Absolute: 0.1 10*3/uL (ref 0.0–0.5)
Eosinophils Relative: 3 %
HEMATOCRIT: 41.4 % (ref 39.0–52.0)
HEMOGLOBIN: 13.8 g/dL (ref 13.0–17.0)
IMMATURE GRANULOCYTES: 0 %
LYMPHS ABS: 1.3 10*3/uL (ref 0.7–4.0)
LYMPHS PCT: 46 %
MCH: 29.4 pg (ref 26.0–34.0)
MCHC: 33.3 g/dL (ref 30.0–36.0)
MCV: 88.3 fL (ref 80.0–100.0)
Monocytes Absolute: 0.3 10*3/uL (ref 0.1–1.0)
Monocytes Relative: 10 %
NEUTROS ABS: 1.1 10*3/uL — AB (ref 1.7–7.7)
NEUTROS PCT: 40 %
Platelets: 147 10*3/uL — ABNORMAL LOW (ref 150–400)
RBC: 4.69 MIL/uL (ref 4.22–5.81)
RDW: 13.3 % (ref 11.5–15.5)
WBC: 2.9 10*3/uL — AB (ref 4.0–10.5)
nRBC: 0 % (ref 0.0–0.2)

## 2018-09-24 NOTE — ED Provider Notes (Signed)
[name] EMERGENCY DEPARTMENT Provider Note   CSN: [id number] Arrival date & time: 09/24/18  1218     History   Chief Complaint No chief complaint on file.   HPI Ryan Pope is a 73 y.o. male.  The history is provided by the patient and a relative. No language interpreter was used.     73 year old male with history of dementia, prostate cancer, brought in by daughter for evaluation of worsening dementia.  Per daughter, patient was living by himself but with his progressive worsening dementia, a week ago he wandering off from his house, and since then, daughter has been staying with him throughout the day to monitor him while awaiting for her house to be renovated to accommodate him.  Daughter mention she is unable to bathe him appropriately and she is worried that he is not being cared for the way he should because he has not been bathing himself.  Otherwise, patient has been able to perform his normal daily activities.  Daughter brought food for him to eat.  No report of any recent sickness, nausea vomiting diarrhea, any complaints of pain.  She did reach out to the [facility] for further assessments of his condition but was told to come to ER.  At this time patient denies any specific complaint.  Past Medical History:  Diagnosis Date  . Asbestos exposure   . BPH (benign prostatic hyperplasia)   . ED (erectile dysfunction)   . History of agent Orange exposure   . History of low back pain   . Hypercholesteremia   . Prostate cancer ([place])   . Recurrent inguinal hernia     There are no active problems to display for this patient.   The histories are not reviewed yet. Please review them in the "History" navigator section and refresh this [place].      Home Medications    Prior to Admission medications   Medication Sig Start Date End Date Taking? Authorizing Provider  bisacodyl (DULCOLAX) 5 MG EC tablet Take 5 mg by mouth daily as needed for  moderate constipation.    [provider]  cholecalciferol (VITAMIN D) 1000 units tablet Take 1,000 Units by mouth daily.    [provider]  cycloSPORINE (RESTASIS) 0.05 % ophthalmic emulsion 1 drop 2 (two) times daily.    [provider]  loratadine (CLARITIN) 10 MG tablet Take 10 mg by mouth daily.    [provider]  Multiple Vitamin (MULTIVITAMIN) capsule Take 1 capsule by mouth daily.    [provider]  sildenafil (VIAGRA) 100 MG tablet Take 100 mg by mouth daily as needed for erectile dysfunction. Reported on 09/25/2015    [provider]    Family History Family History  Problem Relation Age of Onset  . Lung cancer Mother   . Prostate cancer Maternal Uncle     Social History Social History   Tobacco Use  . Smoking status: Never Smoker  Substance Use Topics  . Alcohol use: No    Alcohol/week: 0.0 standard drinks  . Drug use: No     Allergies   Zoloft [sertraline hcl]   Review of Systems Review of Systems  All other systems reviewed and are negative.    Physical Exam Updated Vital Signs There were no vitals taken for this visit.  Physical Exam Vitals signs and nursing note reviewed.  Constitutional:      General: He is not in acute distress.    Appearance: He  is well-developed.  HENT:     Head: Atraumatic.     Nose: Nose normal.  Eyes:     Conjunctiva/sclera: Conjunctivae normal.  Neck:     Musculoskeletal: Neck supple.  Cardiovascular:     Rate and Rhythm: Normal rate and regular rhythm.  Pulmonary:     Effort: Pulmonary effort is normal.     Breath sounds: Normal breath sounds.  Abdominal:     Palpations: Abdomen is soft.  Musculoskeletal:     Comments: 5 out of 5 strength to all 4 extremities.  Skin:    Findings: No rash.     Comments: No skin rash, no signs of cellulitis signs of skin breakdown noted.  Neurological:     Mental Status: He is alert.     GCS: GCS eye subscore is 4. GCS  verbal subscore is 5. GCS motor subscore is 6.     Cranial Nerves: Cranial nerves are intact.     Sensory: Sensation is intact.     Motor: Motor function is intact.     Coordination: Coordination is intact.     Gait: Gait is intact.     Comments: Alert and oriented to self but not to place, time, or situation.  Psychiatric:        Mood and Affect: Mood normal.      ED Treatments / Results  Labs (all labs ordered are listed, but only abnormal results are displayed) Labs Reviewed  CBC WITH DIFFERENTIAL/PLATELET - Abnormal; Notable for the following components:      Result Value   WBC 2.9 (*)    Platelets 147 (*)    Neutro Abs 1.1 (*)    All other components within normal limits  BASIC METABOLIC PANEL - Abnormal; Notable for the following components:   Glucose, Bld 69 (*)    GFR calc non Af Amer 58 (*)    All other components within normal limits    EKG None  Radiology No results found.  Procedures Procedures (including critical care time)  Medications Ordered in ED Medications - No data to display   Initial Impression / Assessment and Plan / ED Course  I have reviewed the triage vital signs and the nursing notes.  Pertinent labs & imaging results that were available during my care of the patient were reviewed by me and considered in my medical decision making (see chart for details).     BP 126/69   Pulse (!) 53   Temp 97.7 F (36.5 C) (Oral)   Resp 16   SpO2 98%    Final Clinical Impressions(s) / ED Diagnoses   Final diagnoses:  Dementia without behavioral disturbance, unspecified dementia type [facility])    ED Discharge Orders    None     1:28 PM Daughter brought patient here due to his worsening dementia.  She is concerned that he has not bathed in the past week and work like for the staff to bathe patient and to assess for any kind of skin breakdown.  On exam, patient is well-kept, no evidence of skin breakdown.  He does have underlying dementia but  currently in no acute discomfort and answer questions appropriately.  He does have some skin flakes to his feet in which I cleans with a wet towel and soap.  No signs of cellulitis or pressure ulcers.  Will check basic labs.  2:25 PM Labs are reassuring, glucose is 69, patient able to eat and drink.  Family felt comfortable taking patient home.  Recommend outpatient follow-up as needed.  Clean pair of clothes available for patient.  Return precaution discussed.  Care discussed with DR. Curatolo.   Domenic Moras, PA-C 09/24/18 [place], [place], DO 09/24/18 [phone]

## 2018-09-24 NOTE — ED Notes (Signed)
Tech obtaining blood.

## 2018-09-24 NOTE — ED Notes (Signed)
Patient verbalizes understanding of discharge instructions. Opportunity for questioning and answers were provided. Armband removed by staff, pt discharged from ED.  

## 2018-09-24 NOTE — ED Triage Notes (Signed)
Patient arrived from home with daughter. He is unsure why he is here, history of dementia-confused and cooperative.  Daughter did not want to discuss issue in front of patient. She told this RN that she brought her father to the ED because she wanted him to be fully examined to see if he has skin sores becasuse he has not taken a shower in a long time.  Patient had multiple clothing on-skin appears dry and intact

## 2018-10-12 ENCOUNTER — Emergency Department (HOSPITAL_COMMUNITY)
Admission: EM | Admit: 2018-10-12 | Discharge: 2018-10-13 | Disposition: A | Discharge status: Home / Self Care | Payer: No Typology Code available for payment source | Attending: Emergency Medicine | Admitting: Emergency Medicine

## 2018-10-12 DIAGNOSIS — Z0489 Encounter for examination and observation for other specified reasons: Secondary | ICD-10-CM | POA: Diagnosis present

## 2018-10-12 DIAGNOSIS — R451 Restlessness and agitation: Secondary | ICD-10-CM | POA: Diagnosis not present

## 2018-10-12 DIAGNOSIS — F0281 Dementia in other diseases classified elsewhere with behavioral disturbance: Secondary | ICD-10-CM | POA: Diagnosis not present

## 2018-10-12 DIAGNOSIS — F0391 Unspecified dementia with behavioral disturbance: Secondary | ICD-10-CM | POA: Diagnosis not present

## 2018-10-12 DIAGNOSIS — Z79899 Other long term (current) drug therapy: Secondary | ICD-10-CM | POA: Diagnosis not present

## 2018-10-12 DIAGNOSIS — F918 Other conduct disorders: Secondary | ICD-10-CM | POA: Diagnosis not present

## 2018-10-12 DIAGNOSIS — R4689 Other symptoms and signs involving appearance and behavior: Secondary | ICD-10-CM | POA: Diagnosis not present

## 2018-10-12 DIAGNOSIS — G309 Alzheimer's disease, unspecified: Secondary | ICD-10-CM | POA: Insufficient documentation

## 2018-10-12 DIAGNOSIS — F0390 Unspecified dementia without behavioral disturbance: Secondary | ICD-10-CM | POA: Diagnosis not present

## 2018-10-12 DIAGNOSIS — R4182 Altered mental status, unspecified: Secondary | ICD-10-CM | POA: Insufficient documentation

## 2018-10-12 LAB — COMPREHENSIVE METABOLIC PANEL
ALT: 14 U/L (ref 0–44)
AST: 25 U/L (ref 15–41)
Albumin: 4 g/dL (ref 3.5–5.0)
Alkaline Phosphatase: 47 U/L (ref 38–126)
Anion gap: 9 (ref 5–15)
BUN: 21 mg/dL (ref 8–23)
CO2: 27 mmol/L (ref 22–32)
CREATININE: 1.13 mg/dL (ref 0.61–1.24)
Calcium: 9 mg/dL (ref 8.9–10.3)
Chloride: 105 mmol/L (ref 98–111)
GFR calc Af Amer: >60 mL/min (ref >60)
GFR calc non Af Amer: >60 mL/min (ref >60)
Glucose, Bld: 128 mg/dL — ABNORMAL HIGH (ref 70–99)
Potassium: 4.1 mmol/L (ref 3.5–5.1)
Sodium: 141 mmol/L (ref 135–145)
Total Bilirubin: 0.9 mg/dL (ref 0.3–1.2)
Total Protein: 6.9 g/dL (ref 6.5–8.1)

## 2018-10-12 LAB — CBC
HCT: 40.2 % (ref 39.0–52.0)
Hemoglobin: 13.9 g/dL (ref 13.0–17.0)
MCH: 30.6 pg (ref 26.0–34.0)
MCHC: 34.6 g/dL (ref 30.0–36.0)
MCV: 88.5 fL (ref 80.0–100.0)
Platelets: 162 10*3/uL (ref 150–400)
RBC: 4.54 MIL/uL (ref 4.22–5.81)
RDW: 13.5 % (ref 11.5–15.5)
WBC: 3.9 10*3/uL — ABNORMAL LOW (ref 4.0–10.5)
nRBC: 0 % (ref 0.0–0.2)

## 2018-10-12 MED ORDER — SODIUM CHLORIDE 0.9% FLUSH: 3.0000 mL | Freq: Once | INTRAVENOUS | Status: DC

## 2018-10-12 NOTE — ED Triage Notes (Signed)
Pt here with family from home for evaluation of AMS that is worse at nights. Family states that pt has hx of dementia and is more altered at night.  Pt A&Ox2 disoriented to time and situation.  Family states most care is at the [facility].

## 2018-10-13 ENCOUNTER — Emergency Department (HOSPITAL_COMMUNITY): Payer: No Typology Code available for payment source

## 2018-10-13 LAB — URINALYSIS, ROUTINE W REFLEX MICROSCOPIC
BILIRUBIN URINE: NEGATIVE
Glucose, UA: NEGATIVE mg/dL
Hgb urine dipstick: NEGATIVE
KETONES UR: NEGATIVE mg/dL
LEUKOCYTE UA: NEGATIVE
NITRITE: NEGATIVE
Protein, ur: NEGATIVE mg/dL
Specific Gravity, Urine: 1.026 (ref 1.005–1.030)
pH: 5 (ref 5.0–8.0)

## 2018-10-13 LAB — RAPID URINE DRUG SCREEN, HOSP PERFORMED
Amphetamines: NOT DETECTED
BENZODIAZEPINES: NOT DETECTED
Barbiturates: NOT DETECTED
Cocaine: NOT DETECTED
Opiates: NOT DETECTED
Tetrahydrocannabinol: NOT DETECTED

## 2018-10-13 NOTE — ED Notes (Addendum)
TTS counselor advised RN that pt.did not meet criteria for  in- patient behavior heath services but will refer to social worker for skilled nursing care placement / memory care facility .

## 2018-10-13 NOTE — Progress Notes (Signed)
Received a phone call requesting that daughter, Vedia Pereyra, have me contact her regarding her father's disposition.  Attempted to contact daughter at [phone] and no one answered.  Reviewed patient's chart and there was no indication of patient having any suicidal homicidal ideations.  Patient has a history of dementia and it appears that he is disease has progressed as he is attempted to leave the home and is sundowning.  Contacted Dr. Sherry Ruffing and discussed the patient with him and he is also stated that he requested social work and case management to see the patient to assist with potential memory care unit placement.  At this time patient is still cleared from a psychiatric standpoint.

## 2018-10-13 NOTE — BH Assessment (Signed)
Clinician contacted pt's daughter, Emilia Beck, with pt's verbal permission, to get collaboration. The information gathered from pt's daughter can be found in [facility] Assessment. Pt's daughter stated she is pt's Power of [place], though clinician has not confirmed this.  Joellyn Rued, daughter: [phone]

## 2018-10-13 NOTE — Discharge Instructions (Addendum)
It is very important that you follow-up with your primary care doctor to help with placement and assistance. Your work-up today was reassuring. Return to the emergency room with any new, worsening, concerning symptoms.

## 2018-10-13 NOTE — ED Notes (Signed)
Breakfast Tray Ordered. 

## 2018-10-13 NOTE — Progress Notes (Addendum)
13:38: CSW spoke with patients daughter, Vedia Pereyra, via phone- daughter wanting patient to return home at this time. Daughter will continue to stay with patient until long-term arrangements can be made. Patient has PCP through the [facility] [place] and daughter will follow up with them.   CSW spoke with patients daughter, Joellyn Rued [phone], regarding disposition plans. CSW informed Ms. Spinks that patient has been psychiatrically cleared and is stable to be released from [facility] ED. Ms. Myrtie Neither requested to speak with the psychiatrist due to her wanting patient to go to a gero-psych facility. CSW informed Ms. Spinks that assessment had already been completed with recommendations- daughter still requesting to speak with psychiatrist.   CSW will follow up with daughter for continued discharge plans.   Kingsley Spittle, LCSW Clinical Social Worker  [facility] [place]  [phone]

## 2018-10-13 NOTE — BH Assessment (Addendum)
Tele Assessment Note   Patient Name: Ryan Pope MRN: [id number] Referring Physician: Dr. Pryor Curia, DO Location of Patient: [facility] Location of Provider: [place] [facility] is a 73 y.o. male who was brought to [facility] by his family due to ongoing concerns regarding his dementia. Pt's daughter shares she moved in with her father several weeks ago and that, up until that time, she didn't realize how poorly her father was doing. Pt's daughter shares her father will be in his home and begin to request that his daughter take him home; she states that, the more she tries to reason with him, the more agitated he becomes. Pt's daughter states that tonight she tried to do something different, so she called her brother and he drove their father around and eventually took him back home, showing him the number to the house, the home, the driveway, and that for about 10-15 minutes he was appeased, but that after that time he began arguing and demanding to be taken home again, asking why they were trying to trick him. It was at that time that pt's daughter and son agreed pt needed to go back to the hospital; pt had been in the hospital earlier in the week (on Saturday, October 09, 2018) for similar thoughts and behaviors.  Pt shares that things are going well--he denies SI, HI, AVH, and NSSIB. He denies any SA or that he has access to any weapons. Pt denies any involvement in the legal system and states that he has no problems getting around in his home, states that he can walk around fine, and that he can shower and dress himself daily. Pt's daughter states that, while her father is not suicidal, she does believe he is depressed. She states he has begun talking about things that he's never said before, such as that he misses their mother that left the family 33 years ago, leaving pt to raise 4 children by himself. Pt's daughter shared that she was able to get pt to  shower and change clothes two weeks ago but that pt has not changed his clothes since then, leaving her to believe that he has also not showered, which worries her that he might have infections or skin problems. Pt's daughter shares that pt has been getting lost in his own home, entering rooms, such as bathrooms, and not being able to figure out how to get out of them. Pt's daughter shares that her father believes that when he goes to a closet it will lead to something else, such as another building. She shares that her father has also been thinking that people have broken into the home, as he sees people in the mirror when he looks into them, presumably because he doesn't recognize himself. Pt's daughter shares pt keeps knick-knacks in his pockets, such as pens, coins, etc., and that all day and night he'll go through his pockets and look at them and organize them. She shares that he'll accuse people of stealing his things if he thinks anything is missing.  Pt is oriented x2; he is not able to identify the date, despite his birthday being three days ago. Pt does not know where he is (he stated, specifically, that he was not at the hospital), though he does know who he is and that he is in the [place]. Pt's recent and remote memory could not be determined. Pt was pleasant throughout the assessment process, which  pt's daughter shares is a hindrance, as she shares that everyone enjoys her father and his attitude so much that he's able to fool people that nothing's wrong. Pt's insight, judgement, and impulse control is impaired at this time.   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 Asbestos exposure   . BPH (benign prostatic hyperplasia)   . ED (erectile dysfunction)   . History of agent Orange exposure   . History of low back pain   .  Hypercholesteremia   . Prostate cancer ([place])   . Recurrent inguinal hernia      Family History:  Family History  Problem Relation Age of Onset  . Lung cancer Mother   . Prostate cancer Maternal Uncle     Social History:  reports that he has never smoked. He does not have any smokeless tobacco history on file. He reports that he does not drink alcohol or use drugs.  Additional Social History:     CIWA: CIWA-Ar BP: 139/78 Pulse Rate: 66 COWS:    Allergies:  Allergies  Allergen Reactions  . Zoloft [Sertraline Hcl]     Home Medications: (Not in a hospital admission)   OB/GYN Status:  No LMP for male patient.  General Assessment Data Location of Assessment: [facility] TTS Assessment: In system Is this a Tele or Face-to-Face Assessment?: Tele Assessment Is this an Initial Assessment or a Re-assessment for this encounter?: Initial Assessment Admission Status: Voluntary           Risk to self with the past 6 months Suicidal Ideation: No        Mental Status Report Motor Activity: Unremarkable  Cognitive Functioning Appetite: Good Have you had any weight changes? : No Change Sleep: No Change Total Hours of Sleep: (Unknown) Vegetative Symptoms: None  ADLScreening [facility] Assessment Services) Patient able to express need for assistance with ADLs?: Yes Independently performs ADLs?: Yes (appropriate for developmental age)        ADL Screening (condition at time of admission) Is the patient deaf or have difficulty hearing?: No Does the patient have difficulty seeing, even when wearing glasses/contacts?: No Does the patient have difficulty concentrating, remembering, or making decisions?: Yes Patient able to express need for assistance with ADLs?: Yes Does the patient have difficulty dressing or bathing?: No Independently performs ADLs?: Yes (appropriate for developmental age) Does the patient have difficulty walking or climbing stairs?: No Weakness of Legs:  None Weakness of Arms/Hands: None  Home Assistive Devices/Equipment Home Assistive Devices/Equipment: None  Therapy Consults (therapy consults require a physician order) PT Evaluation Needed: No OT Evalulation Needed: No SLP Evaluation Needed: No Abuse/Neglect Assessment (Assessment to be complete while patient is alone) Abuse/Neglect Assessment Can Be Completed: Unable to assess, patient is non-responsive or altered mental status Values / Beliefs Cultural Requests During Hospitalization: None Spiritual Requests During Hospitalization: None Consults Spiritual Care Consult Needed: No Social Work Consult Needed: No         Disposition: Patriciaann Clan, PA, reviewed pt's chart and information and determined pt does not meet criteria for inpatient hospitalization but that he is not safe to return home; [place] recommends a SW consult to assist in identifying placement for pt. This information was provided to pt's nurse, Clydene Laming, at [phone]. The SW order was made by clinician at [phone].    This service was provided via telemedicine using a 2-way, interactive audio  and Radiographer, therapeutic.  Names of all persons participating in this telemedicine service and their role in this encounter. Name: Ryan Pope Role: Patient  Name: Joellyn Rued Role: Patient's Daughter  Name: Windell Hummingbird Role: Clinician    Dannielle Burn 10/13/2018 5:41 AM

## 2018-10-13 NOTE — ED Notes (Addendum)
Patient wanded by security , pt. wearing maroon paper scrubs , sitter at bedside .

## 2018-10-13 NOTE — ED Notes (Signed)
Ordered breakfast 
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
## 2018-10-13 NOTE — ED Notes (Signed)
EDP explained plan of care to pt.'s daughter .

## 2018-10-13 NOTE — Discharge Planning (Signed)
[facility] notified (text) EDSW of daughter arrival.

## 2018-10-13 NOTE — ED Notes (Signed)
TTS video interview in progress , purple scrubs given to pt. , his clothes and personal belongings given to daughter to take home .

## 2018-11-05 DIAGNOSIS — Z111 Encounter for screening for respiratory tuberculosis: Secondary | ICD-10-CM | POA: Diagnosis not present

## 2018-11-08 DIAGNOSIS — Z111 Encounter for screening for respiratory tuberculosis: Secondary | ICD-10-CM | POA: Diagnosis not present

## 2018-12-10 DIAGNOSIS — F0391 Unspecified dementia with behavioral disturbance: Secondary | ICD-10-CM | POA: Diagnosis not present

## 2018-12-10 DIAGNOSIS — F419 Anxiety disorder, unspecified: Secondary | ICD-10-CM | POA: Diagnosis not present

## 2019-01-05 DIAGNOSIS — F0390 Unspecified dementia without behavioral disturbance: Secondary | ICD-10-CM | POA: Diagnosis not present

## 2019-01-05 DIAGNOSIS — F419 Anxiety disorder, unspecified: Secondary | ICD-10-CM | POA: Diagnosis not present

## 2019-01-05 DIAGNOSIS — F5102 Adjustment insomnia: Secondary | ICD-10-CM | POA: Diagnosis not present

## 2019-01-11 DIAGNOSIS — Z79899 Other long term (current) drug therapy: Secondary | ICD-10-CM | POA: Diagnosis not present

## 2019-01-12 ENCOUNTER — Emergency Department (HOSPITAL_COMMUNITY): Payer: Medicare PPO

## 2019-01-12 ENCOUNTER — Other Ambulatory Visit: Payer: Self-pay

## 2019-01-12 ENCOUNTER — Inpatient Hospital Stay (HOSPITAL_COMMUNITY)
Admission: EM | Admit: 2019-01-12 | Discharge: 2019-01-19 | DRG: 177 | Disposition: A | Discharge status: Skilled Nursing Facility | Payer: Medicare PPO | Source: Skilled Nursing Facility | Attending: Internal Medicine | Admitting: Internal Medicine

## 2019-01-12 ENCOUNTER — Encounter (HOSPITAL_COMMUNITY): Payer: Self-pay | Admitting: Emergency Medicine

## 2019-01-12 DIAGNOSIS — Z888 Allergy status to other drugs, medicaments and biological substances status: Secondary | ICD-10-CM

## 2019-01-12 DIAGNOSIS — Z77090 Contact with and (suspected) exposure to asbestos: Secondary | ICD-10-CM | POA: Diagnosis not present

## 2019-01-12 DIAGNOSIS — G9341 Metabolic encephalopathy: Secondary | ICD-10-CM | POA: Diagnosis not present

## 2019-01-12 DIAGNOSIS — Z801 Family history of malignant neoplasm of trachea, bronchus and lung: Secondary | ICD-10-CM

## 2019-01-12 DIAGNOSIS — Z7401 Bed confinement status: Secondary | ICD-10-CM | POA: Diagnosis not present

## 2019-01-12 DIAGNOSIS — E860 Dehydration: Secondary | ICD-10-CM | POA: Diagnosis present

## 2019-01-12 DIAGNOSIS — N400 Enlarged prostate without lower urinary tract symptoms: Secondary | ICD-10-CM | POA: Diagnosis present

## 2019-01-12 DIAGNOSIS — Z8546 Personal history of malignant neoplasm of prostate: Secondary | ICD-10-CM

## 2019-01-12 DIAGNOSIS — K219 Gastro-esophageal reflux disease without esophagitis: Secondary | ICD-10-CM | POA: Diagnosis not present

## 2019-01-12 DIAGNOSIS — R4182 Altered mental status, unspecified: Secondary | ICD-10-CM

## 2019-01-12 DIAGNOSIS — M2550 Pain in unspecified joint: Secondary | ICD-10-CM | POA: Diagnosis not present

## 2019-01-12 DIAGNOSIS — Z209 Contact with and (suspected) exposure to unspecified communicable disease: Secondary | ICD-10-CM | POA: Diagnosis not present

## 2019-01-12 DIAGNOSIS — U071 COVID-19: Principal | ICD-10-CM | POA: Diagnosis present

## 2019-01-12 DIAGNOSIS — R410 Disorientation, unspecified: Secondary | ICD-10-CM | POA: Diagnosis not present

## 2019-01-12 DIAGNOSIS — E7800 Pure hypercholesterolemia, unspecified: Secondary | ICD-10-CM | POA: Diagnosis not present

## 2019-01-12 DIAGNOSIS — R627 Adult failure to thrive: Secondary | ICD-10-CM | POA: Diagnosis present

## 2019-01-12 DIAGNOSIS — F0390 Unspecified dementia without behavioral disturbance: Secondary | ICD-10-CM | POA: Diagnosis not present

## 2019-01-12 DIAGNOSIS — R0902 Hypoxemia: Secondary | ICD-10-CM | POA: Diagnosis not present

## 2019-01-12 DIAGNOSIS — Z8042 Family history of malignant neoplasm of prostate: Secondary | ICD-10-CM

## 2019-01-12 LAB — COMPREHENSIVE METABOLIC PANEL
ALT: 16 U/L (ref 0–44)
AST: 28 U/L (ref 15–41)
Albumin: 4.1 g/dL (ref 3.5–5.0)
Alkaline Phosphatase: 67 U/L (ref 38–126)
Anion gap: 7 (ref 5–15)
BUN: 18 mg/dL (ref 8–23)
CO2: 28 mmol/L (ref 22–32)
Calcium: 8.8 mg/dL — ABNORMAL LOW (ref 8.9–10.3)
Chloride: 104 mmol/L (ref 98–111)
Creatinine, Ser: 1.2 mg/dL (ref 0.61–1.24)
GFR calc Af Amer: >60 mL/min (ref >60)
GFR calc non Af Amer: 59 mL/min — ABNORMAL LOW (ref >60)
Glucose, Bld: 104 mg/dL — ABNORMAL HIGH (ref 70–99)
Potassium: 4.3 mmol/L (ref 3.5–5.1)
Sodium: 139 mmol/L (ref 135–145)
Total Bilirubin: 1 mg/dL (ref 0.3–1.2)
Total Protein: 7.3 g/dL (ref 6.5–8.1)

## 2019-01-12 LAB — CBC WITH DIFFERENTIAL/PLATELET
Abs Immature Granulocytes: 0.01 10*3/uL (ref 0.00–0.07)
Basophils Absolute: 0 10*3/uL (ref 0.0–0.1)
Basophils Relative: 1 %
Eosinophils Absolute: 0.1 10*3/uL (ref 0.0–0.5)
Eosinophils Relative: 2 %
HCT: 40.6 % (ref 39.0–52.0)
Hemoglobin: 13.8 g/dL (ref 13.0–17.0)
Immature Granulocytes: 0 %
Lymphocytes Relative: 42 %
Lymphs Abs: 1.1 10*3/uL (ref 0.7–4.0)
MCH: 30.1 pg (ref 26.0–34.0)
MCHC: 34 g/dL (ref 30.0–36.0)
MCV: 88.5 fL (ref 80.0–100.0)
Monocytes Absolute: 0.3 10*3/uL (ref 0.1–1.0)
Monocytes Relative: 10 %
Neutro Abs: 1.1 10*3/uL — ABNORMAL LOW (ref 1.7–7.7)
Neutrophils Relative %: 45 %
Platelets: 163 10*3/uL (ref 150–400)
RBC: 4.59 MIL/uL (ref 4.22–5.81)
RDW: 12.6 % (ref 11.5–15.5)
WBC: 2.5 10*3/uL — ABNORMAL LOW (ref 4.0–10.5)
nRBC: 0 % (ref 0.0–0.2)

## 2019-01-12 LAB — URINALYSIS, ROUTINE W REFLEX MICROSCOPIC
Bilirubin Urine: NEGATIVE
Glucose, UA: NEGATIVE mg/dL
Hgb urine dipstick: NEGATIVE
Ketones, ur: NEGATIVE mg/dL
Leukocytes,Ua: NEGATIVE
Nitrite: NEGATIVE
Protein, ur: NEGATIVE mg/dL
Specific Gravity, Urine: 1.014 (ref 1.005–1.030)
pH: 7 (ref 5.0–8.0)

## 2019-01-12 LAB — CREATININE, SERUM
Creatinine, Ser: 1.17 mg/dL (ref 0.61–1.24)
GFR calc Af Amer: >60 mL/min (ref >60)
GFR calc non Af Amer: >60 mL/min (ref >60)

## 2019-01-12 LAB — RAPID URINE DRUG SCREEN, HOSP PERFORMED
Amphetamines: NOT DETECTED
Barbiturates: NOT DETECTED
Benzodiazepines: NOT DETECTED
Cocaine: NOT DETECTED
Opiates: NOT DETECTED
Tetrahydrocannabinol: NOT DETECTED

## 2019-01-12 LAB — FERRITIN: Ferritin: 247 ng/mL (ref 24–336)

## 2019-01-12 LAB — CBC
HCT: 41.9 % (ref 39.0–52.0)
Hemoglobin: 14.5 g/dL (ref 13.0–17.0)
MCH: 30.3 pg (ref 26.0–34.0)
MCHC: 34.6 g/dL (ref 30.0–36.0)
MCV: 87.5 fL (ref 80.0–100.0)
Platelets: 174 10*3/uL (ref 150–400)
RBC: 4.79 MIL/uL (ref 4.22–5.81)
RDW: 12.6 % (ref 11.5–15.5)
WBC: 3.4 10*3/uL — ABNORMAL LOW (ref 4.0–10.5)
nRBC: 0 % (ref 0.0–0.2)

## 2019-01-12 LAB — LACTIC ACID, PLASMA: Lactic Acid, Venous: 0.7 mmol/L (ref 0.5–1.9)

## 2019-01-12 LAB — C-REACTIVE PROTEIN: CRP: <0.8 mg/dL (ref <1.0)

## 2019-01-12 LAB — MAGNESIUM: Magnesium: 2.4 mg/dL (ref 1.7–2.4)

## 2019-01-12 LAB — TROPONIN I: Troponin I: <0.03 ng/mL (ref <0.03)

## 2019-01-12 LAB — AMMONIA: Ammonia: <9 umol/L — ABNORMAL LOW (ref 9–35)

## 2019-01-12 LAB — D-DIMER, QUANTITATIVE: D-Dimer, Quant: 0.5 ug/mL-FEU (ref 0.00–0.50)

## 2019-01-12 LAB — SARS CORONAVIRUS 2 BY RT PCR (HOSPITAL ORDER, PERFORMED IN ~~LOC~~ HOSPITAL LAB): SARS Coronavirus 2: POSITIVE — AB

## 2019-01-12 LAB — ABO/RH: ABO/RH(D): B NEG

## 2019-01-12 MED ORDER — ONDANSETRON HCL 4 MG/2ML IJ SOLN: 4.0000 mg | Freq: Four times a day (QID) | INTRAMUSCULAR | Status: DC | PRN

## 2019-01-12 MED ORDER — ACETAMINOPHEN 325 MG PO TABS
650.0000 mg | ORAL_TABLET | Freq: Four times a day (QID) | ORAL | Status: DC | PRN
  Administered 2019-01-14: 650 mg via ORAL
  Filled 2019-01-12: qty 2

## 2019-01-12 MED ORDER — ONDANSETRON HCL 4 MG PO TABS: 4.0000 mg | ORAL_TABLET | Freq: Four times a day (QID) | ORAL | Status: DC | PRN

## 2019-01-12 MED ORDER — SODIUM CHLORIDE 0.9 % IV SOLN
INTRAVENOUS | Status: DC
  Administered 2019-01-12 – 2019-01-13 (×2): via INTRAVENOUS

## 2019-01-12 MED ORDER — ENOXAPARIN SODIUM 40 MG/0.4ML ~~LOC~~ SOLN
40.0000 mg | SUBCUTANEOUS | Status: DC
  Administered 2019-01-12: 40 mg via SUBCUTANEOUS
  Filled 2019-01-12: qty 0.4

## 2019-01-12 MED ORDER — TRAZODONE HCL 50 MG PO TABS
50.0000 mg | ORAL_TABLET | Freq: Every day | ORAL | Status: DC
  Administered 2019-01-12 – 2019-01-18 (×7): 50 mg via ORAL
  Filled 2019-01-12 (×7): qty 1

## 2019-01-12 MED ORDER — CALCIUM CARBONATE ANTACID 500 MG PO CHEW
1.0000 | CHEWABLE_TABLET | Freq: Four times a day (QID) | ORAL | Status: DC | PRN
  Filled 2019-01-12: qty 1

## 2019-01-12 MED ORDER — SENNOSIDES-DOCUSATE SODIUM 8.6-50 MG PO TABS: 1.0000 | ORAL_TABLET | Freq: Every evening | ORAL | Status: DC | PRN

## 2019-01-12 NOTE — ED Notes (Signed)
ED TO INPATIENT HANDOFF REPORT  ED Nurse Name and Phone #: Barrie Dunker [phone]  S Name/Age/Gender Ryan Pope 73 y.o. male Room/Bed: WA20/WA20  Code Status   Code Status: Prior  Home/SNF/Other Skilled nursing facility Patient oriented to: self Is this baseline? Yes   Triage Complete: Triage complete  Chief Complaint dementia  Triage Note Patient arrived by EMS from [facility]. Staff at facility stated that patient had a change of behavior. Staff stated he used to eat and talk. Pt is not not eating, talking, and not arousable. Staff stated this has been happening for the past 2 days. Pt c/o head hurting.   EMS VS BP 128/73, RR 16, SpO2 100% on RA, CBG 121, T 98.1 F, and HR 52.   Hx of Dementia.    Allergies Allergies  Allergen Reactions  . Zoloft [Sertraline Hcl]     Level of Care/Admitting Diagnosis ED Disposition    ED Disposition Condition [place] [facility]: [place] [[id number]]  Level of Care: Med-Surg [16]  Covid Evaluation: Confirmed COVID Positive  Isolation Risk Level: Low Risk/Droplet (Less than 4L [place] supplementation)  Diagnosis: COVID-19 virus infection [[id number]]  Admitting Physician: [place] ERIC J [[id number]]  Attending Physician: [place]IC J [[id number]]  Estimated length of stay: past midnight tomorrow  Certification:: I certify this patient will need inpatient services for at least 2 midnights  PT Class (Do Not Modify): Inpatient [101]  PT Acc Code (Do Not Modify): Private [1]       B Medical/Surgery History Past Medical History:  Diagnosis Date  . Asbestos exposure   . BPH (benign prostatic hyperplasia)   . ED (erectile dysfunction)   . History of agent Orange exposure   . History of low back pain   . Hypercholesteremia   . Prostate cancer ([place])   . Recurrent inguinal hernia    History reviewed. No pertinent surgical history.   A IV Location/Drains/Wounds Patient Lines/Drains/Airways  Status   Active Line/Drains/Airways    Name:   Placement date:   Placement time:   Site:   Days:   Peripheral IV 01/12/19 Left Antecubital   01/12/19    1422    Antecubital   less than 1          Intake/Output Last 24 hours No intake or output data in the 24 hours ending 01/12/19 1424  Labs/Imaging Results for orders placed or performed during the hospital encounter of 01/12/19 (from the past 48 hour(s))  CBC with Differential     Status: Abnormal   Collection Time: 01/12/19  9:58 AM  Result Value Ref Range   WBC 2.5 (L) 4.0 - 10.5 K/uL   RBC 4.59 4.22 - 5.81 MIL/uL   Hemoglobin 13.8 13.0 - 17.0 g/dL   HCT 40.6 39.0 - 52.0 %   MCV 88.5 80.0 - 100.0 fL   MCH 30.1 26.0 - 34.0 pg   MCHC 34.0 30.0 - 36.0 g/dL   RDW 12.6 11.5 - 15.5 %   Platelets 163 150 - 400 K/uL   nRBC 0.0 0.0 - 0.2 %   Neutrophils Relative % 45 %   Neutro Abs 1.1 (L) 1.7 - 7.7 K/uL   Lymphocytes Relative 42 %   Lymphs Abs 1.1 0.7 - 4.0 K/uL   Monocytes Relative 10 %   Monocytes Absolute 0.3 0.1 - 1.0 K/uL   Eosinophils Relative 2 %   Eosinophils Absolute 0.1 0.0 - 0.5 K/uL   Basophils Relative 1 %  Basophils Absolute 0.0 0.0 - 0.1 K/uL   Immature Granulocytes 0 %   Abs Immature Granulocytes 0.01 0.00 - 0.07 K/uL    Comment: Performed at [facility], [place] [address], [place], [place] [address]  Comprehensive metabolic panel     Status: Abnormal   Collection Time: 01/12/19  9:58 AM  Result Value Ref Range   Sodium 139 135 - 145 mmol/L   Potassium 4.3 3.5 - 5.1 mmol/L   Chloride 104 98 - 111 mmol/L   CO2 28 22 - 32 mmol/L   Glucose, Bld 104 (H) 70 - 99 mg/dL   BUN 18 8 - 23 mg/dL   Creatinine, Ser 1.20 0.61 - 1.24 mg/dL   Calcium 8.8 (L) 8.9 - 10.3 mg/dL   Total Protein 7.3 6.5 - 8.1 g/dL   Albumin 4.1 3.5 - 5.0 g/dL   AST 28 15 - 41 U/L   ALT 16 0 - 44 U/L   Alkaline Phosphatase 67 38 - 126 U/L   Total Bilirubin 1.0 0.3 - 1.2 mg/dL   GFR calc non Af Amer 59 (L) >60 mL/min    GFR calc Af Amer >60 >60 mL/min   Anion gap 7 5 - 15    Comment: Performed at [facility] [place] [address], [place], [place] [address]  Lactic acid, plasma     Status: None   Collection Time: 01/12/19  9:58 AM  Result Value Ref Range   Lactic Acid, Venous 0.7 0.5 - 1.9 mmol/L    Comment: Performed at [facility], [place] [address], [place], [place] [address]  Troponin I - ONCE - STAT     Status: None   Collection Time: 01/12/19  9:58 AM  Result Value Ref Range   Troponin I <0.03 <0.03 ng/mL    Comment: Performed at [facility], [place] [address], [place], [place] [address]  Ammonia     Status: Abnormal   Collection Time: 01/12/19  9:59 AM  Result Value Ref Range   Ammonia <9 (L) 9 - 35 umol/L    Comment: REPEATED TO VERIFY Performed at [facility], [place] [address] [place], [place] [address]   SARS Coronavirus 2 (CEPHEID - Performed in [place] hospital lab), Hosp Order     Status: Abnormal   Collection Time: 01/12/19 10:05 AM  Result Value Ref Range   SARS Coronavirus 2 POSITIVE (A) NEGATIVE    Comment: RESULT CALLED TO, READ BACK BY AND VERIFIED WITH: BINGHAM,S. RN @1131  ON 5.20.20 BY NMCCOY (NOTE) If result is NEGATIVE SARS-CoV-2 target nucleic acids are NOT DETECTED. The SARS-CoV-2 RNA is generally detectable in upper and lower  respiratory specimens during the acute phase of infection. The lowest  concentration of SARS-CoV-2 viral copies this assay can detect is 250  copies / mL. A negative result does not preclude SARS-CoV-2 infection  and should not be used as the sole basis for treatment or other  patient management decisions.  A negative result may occur with  improper specimen collection / handling, submission of specimen other  than nasopharyngeal swab, presence of viral mutation(s) within the  areas targeted by this assay, and inadequate number of viral copies  (<250 copies / mL). A negative  result must be combined with clinical  observations, patient history, and epidemiological information. If result is POSITIVE SARS-CoV-2 target nucleic acids are DETECTE D. The SARS-CoV-2 RNA is generally detectable in upper and lower  respiratory specimens during the  acute phase of infection.  Positive  results are indicative of active infection with SARS-CoV-2.  Clinical  correlation with patient history and other diagnostic information is  necessary to determine patient infection status.  Positive results do  not rule out bacterial infection or co-infection with other viruses. If result is PRESUMPTIVE POSTIVE SARS-CoV-2 nucleic acids MAY BE PRESENT.   A presumptive positive result was obtained on the submitted specimen  and confirmed on repeat testing.  While 2019 novel coronavirus  (SARS-CoV-2) nucleic acids may be present in the submitted sample  additional confirmatory testing may be necessary for epidemiological  and / or clinical management purposes  to differentiate between  SARS-CoV-2 and other Sarbecovirus currently known to infect humans.  If clinically indicated additional testing with an alternate test  methodology (LAB745 3) is advised. The SARS-CoV-2 RNA is generally  detectable in upper and lower respiratory specimens during the acute  phase of infection. The expected result is Negative. Fact Sheet for Patients:  StrictlyIdeas.no Fact Sheet for Healthcare Providers: BankingDealers.co.za This test is not yet approved or cleared by the [place] FDA and has been authorized for detection and/or diagnosis of SARS-CoV-2 by FDA under an Emergency Use Authorization (EUA).  This EUA will remain in effect (meaning this test can be used) for the duration of the COVID-19 declaration under Section 564(b)(1) of the Act, 21 U.S.C. section 360bbb-3(b)(1), unless the authorization is terminated or revoked sooner. Performed at [facility], [place] [address], [place], [place] [address] CORRECTED ON 05/20 AT 1135: PREVIOUSLY REPORTED AS POSITIVE RESULT CALLED TO, READ BACK BY AND VERIFIED WITH: BINGHAM,S. RN @1131  ON 5.20.20 BY NMCCOY   Urinalysis, Routine w reflex microscopic     Status: None   Collection Time: 01/12/19 10:07 AM  Result Value Ref Range   Color, Urine YELLOW YELLOW   APPearance CLEAR CLEAR   Specific Gravity, Urine 1.014 1.005 - 1.030   pH 7.0 5.0 - 8.0   Glucose, UA NEGATIVE NEGATIVE mg/dL   Hgb urine dipstick NEGATIVE NEGATIVE   Bilirubin Urine NEGATIVE NEGATIVE   Ketones, ur NEGATIVE NEGATIVE mg/dL   Protein, ur NEGATIVE NEGATIVE mg/dL   Nitrite NEGATIVE NEGATIVE   Leukocytes,Ua NEGATIVE NEGATIVE    Comment: Performed at [facility], [place] [address], [place], [place] [address]  Rapid urine drug screen (hospital performed)     Status: None   Collection Time: 01/12/19 10:07 AM  Result Value Ref Range   Opiates NONE DETECTED NONE DETECTED   Cocaine NONE DETECTED NONE DETECTED   Benzodiazepines NONE DETECTED NONE DETECTED   Amphetamines NONE DETECTED NONE DETECTED   Tetrahydrocannabinol NONE DETECTED NONE DETECTED   Barbiturates NONE DETECTED NONE DETECTED    Comment: (NOTE) DRUG SCREEN FOR MEDICAL PURPOSES ONLY.  IF CONFIRMATION IS NEEDED FOR ANY PURPOSE, NOTIFY LAB WITHIN 5 DAYS. LOWEST DETECTABLE LIMITS FOR URINE DRUG SCREEN Drug Class                     Cutoff (ng/mL) Amphetamine and metabolites    1000 Barbiturate and metabolites    200 Benzodiazepine                 962 Tricyclics and metabolites     300 Opiates and metabolites        300 Cocaine and metabolites        300 THC  50 Performed at [facility], [place] [address] [place], [place] [address]    Dg Chest 2 View  Result Date: 01/12/2019 CLINICAL DATA:  Altered mental status from nursing home. EXAM: CHEST - 2 VIEW COMPARISON:  October 13, 2018 FINDINGS: The heart size and mediastinal contours are within normal limits. Both lungs are clear. The visualized skeletal structures are unremarkable. IMPRESSION: No active cardiopulmonary disease. Electronically Signed   By: Abelardo Diesel M.D.   On: 01/12/2019 11:20   Ct Head Wo Contrast  Result Date: 01/12/2019 CLINICAL DATA:  Mental status changes. Altered level of consciousness. EXAM: CT HEAD WITHOUT CONTRAST TECHNIQUE: Contiguous axial images were obtained from the base of the skull through the vertex without intravenous contrast. COMPARISON:  None. FINDINGS: Brain: There is no evidence for acute hemorrhage, hydrocephalus, mass lesion, or abnormal extra-axial fluid collection. No definite CT evidence for acute infarction. Diffuse loss of parenchymal volume is consistent with atrophy. Patchy low attenuation in the deep hemispheric and periventricular white matter is nonspecific, but likely reflects chronic microvascular ischemic demyelination. Vascular: No hyperdense vessel or unexpected calcification. Skull: No evidence for fracture. No worrisome lytic or sclerotic lesion. Sinuses/Orbits: The visualized paranasal sinuses and mastoid air cells are clear. Visualized portions of the globes and intraorbital fat are unremarkable. Other: None. IMPRESSION: 1. No acute intracranial abnormality. 2. 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 Electronically Signed   By: Misty Stanley M.D.   On: 01/12/2019 11:00    Pending Labs Unresulted Labs (From admission, onward)    Start     Ordered   01/12/19 0958  Blood culture (routine x 2)  BLOOD CULTURE X 2,   STAT     01/12/19 0959   01/12/19 0958  Lactic acid, plasma  Now then every 2 hours,   STAT     01/12/19 0959   01/12/19 0958  Urine culture  Add-on,   STAT     01/12/19 0959   Signed and Held  ABO/Rh  Once,   R     Signed and Held   Signed and Held  CBC  (enoxaparin (LOVENOX)    CrCl >/= 30 ml/min)  Once,   R    Comments:   Baseline for enoxaparin therapy IF NOT ALREADY DRAWN.  Notify MD if PLT < 100 K.    Signed and Held   Signed and Held  Creatinine, serum  (enoxaparin (LOVENOX)    CrCl >/= 30 ml/min)  Once,   R    Comments:  Baseline for enoxaparin therapy IF NOT ALREADY DRAWN.    Signed and Held   Signed and Held  Creatinine, serum  (enoxaparin (LOVENOX)    CrCl >/= 30 ml/min)  Weekly,   R    Comments:  while on enoxaparin therapy    Signed and Held   Signed and Held  CBC with Differential/Platelet  Daily,   R     Signed and Held   Signed and Held  Comprehensive metabolic panel  Daily,   R     Signed and Held   Signed and Held  C-reactive protein  Daily,   R     Signed and Held   Signed and Held  D-dimer, quantitative (not at [facility])  Daily,   R     Signed and Held   Signed and Held  Ferritin  Daily,   R     Signed and Held   Signed and Held  Magnesium  Daily,  R     Signed and Held          Vitals/Pain Today's Vitals   01/12/19 1006 01/12/19 1115 01/12/19 1211 01/12/19 1330  BP:  136/85 (!) 133/98 136/84  Pulse:  60 98 (!) 54  Resp:  20 18 16   Temp: 97.8 F (36.6 C)     TempSrc: Rectal     SpO2:  98% 96% 100%  Height:        Isolation Precautions No active isolations  Medications Medications - No data to display  Mobility walks High fall risk   Focused Assessments Neuro Assessment Handoff:  Swallow screen pass? Yes          Neuro Assessment: Exceptions to WDL Neuro Checks:      Last Documented NIHSS Modified Score:   Has TPA been given? No If patient is a Neuro Trauma and patient is going to OR before floor call report to [place] nurse: [phone] or [phone]     R Recommendations: See Admitting Provider Note  Report given to:   Additional Notes:

## 2019-01-12 NOTE — ED Notes (Signed)
This writer attempted to give report to floor x 2. This Probation officer was told that someone would call back to receive report.

## 2019-01-12 NOTE — H&P (Signed)
History and Physical    Ryan Pope SWF:[id number] DOB: 07/25/1946 DOA: 01/12/2019  PCP: Deland Pretty, MD  Patient coming from: [place]  I have personally briefly reviewed patient's old medical records in [place]  Chief Complaint: Altered mental status, poor oral intake  HPI: Ryan Pope is a 73 y.o. male with medical history significant of dementia, prostate cancer, cataracts, GERD, BPH who presents from his [facility] with reported decreased oral intake over the past few days as well as altered mental status that is different from his normal baseline.  Given his dementia, history obtained from ED provider and EMS reports.  Patient appears back to his normal baseline currently.  He has no active complaints.  He is pleasantly confused.  ED provider discussed with social work, SNF unable to take patient back at this time given there is no way for isolation due to space issues.  Unable to obtain any further ROS from patient due to his underlying dementia, but appears comfortable in no acute distress.  ED Course: Temperature 97.8, HR 98, RR 18, BP 133/98, SPO2 96% on room air.  WBC count 2.5, hemoglobin 13.8, platelets 163, sodium 139, potassium 4.3, BUN 18, creatinine 1.20, glucose 104.  Chest x-ray negative for acute disease process.  CT head without intracranial abnormality.  UDS negative.  Urinalysis unremarkable.  Ammonia level less than 9.  Lactic acid 0.7.  COVID-19 test was positive.  Troponin less than 0.03.  EDP attempted patient returned to his SNF, but unsuccessful as they do not currently have a way of self isolating him. EDP referred patient for admission given positive for COVID-19 and need for IV fluid hydration due to poor oral intake over the past few days with some mild dehydration.  Review of Systems: As per HPI otherwise 10 point review of systems negative.    Past Medical History:  Diagnosis Date  . Asbestos exposure    . BPH (benign prostatic hyperplasia)   . ED (erectile dysfunction)   . History of agent Orange exposure   . History of low back pain   . Hypercholesteremia   . Prostate cancer ([place])   . Recurrent inguinal hernia     History reviewed. No pertinent surgical history.   reports that he has never smoked. He has never used smokeless tobacco. He reports that he does not drink alcohol or use drugs.  Allergies  Allergen Reactions  . Zoloft [Sertraline Hcl]     Family History  Problem Relation Age of Onset  . Lung cancer Mother   . Prostate cancer Maternal Uncle    Unacceptable: Noncontributory, unremarkable, or negative. Acceptable: Family history reviewed and not pertinent (If you reviewed it)  Prior to Admission medications   Medication Sig Start Date End Date Taking? Authorizing Provider  calcium carbonate (TUMS - DOSED IN MG ELEMENTAL CALCIUM) 500 MG chewable tablet Chew 1 tablet by mouth every 6 (six) hours as needed for indigestion or heartburn.   Yes [provider]  traZODone (DESYREL) 50 MG tablet Take 50 mg by mouth at bedtime. 12/20/18  Yes [provider]    Physical Exam: Vitals:   01/12/19 1002 01/12/19 1006 01/12/19 1115 01/12/19 1211  BP:   136/85 (!) 133/98  Pulse:   60 98  Resp:   20 18  Temp:  97.8 F (36.6 C)    TempSrc:  Rectal    SpO2:   98% 96%  Height: 6' (1.829 m)  Constitutional: NAD, calm, comfortable Vitals:   01/12/19 1002 01/12/19 1006 01/12/19 1115 01/12/19 1211  BP:   136/85 (!) 133/98  Pulse:   60 98  Resp:   20 18  Temp:  97.8 F (36.6 C)    TempSrc:  Rectal    SpO2:   98% 96%  Height: 6' (1.829 m)      Eyes: PERRL, lids and conjunctivae normal ENMT: Mucous membranes are dry. Posterior pharynx clear of any exudate or lesions.Normal dentition.  Neck: normal, supple, no masses, no thyromegaly Respiratory: clear to auscultation bilaterally, no wheezing, no crackles. Normal respiratory effort. No accessory muscle  use.  Cardiovascular: Regular rate and rhythm, no murmurs / rubs / gallops. No extremity edema. 2+ pedal pulses. No carotid bruits.  Abdomen: no tenderness, no masses palpated. No hepatosplenomegaly. Bowel sounds positive.  Musculoskeletal: no clubbing / cyanosis. No joint deformity upper and lower extremities. Good ROM, no contractures. Normal muscle tone.  Skin: no rashes, lesions, ulcers. No induration Neurologic: CN 2-12 grossly intact. Sensation intact, DTR normal. Strength 5/5 in all 4.  Psychiatric: Pleasantly confused, no agitation  Labs on Admission: I have personally reviewed following labs and imaging studies  CBC: Recent Labs  Lab 01/12/19 0958  WBC 2.5*  NEUTROABS 1.1*  HGB 13.8  HCT 40.6  MCV 88.5  PLT 829   Basic Metabolic Panel: Recent Labs  Lab 01/12/19 0958  NA 139  K 4.3  CL 104  CO2 28  GLUCOSE 104*  BUN 18  CREATININE 1.20  CALCIUM 8.8*   GFR: CrCl cannot be calculated (Unknown ideal weight.). Liver Function Tests: Recent Labs  Lab 01/12/19 0958  AST 28  ALT 16  ALKPHOS 67  BILITOT 1.0  PROT 7.3  ALBUMIN 4.1   No results for input(s): LIPASE, AMYLASE in the last 168 hours. Recent Labs  Lab 01/12/19 0959  AMMONIA <9*   Coagulation Profile: No results for input(s): INR, PROTIME in the last 168 hours. Cardiac Enzymes: Recent Labs  Lab 01/12/19 0958  TROPONINI <0.03   BNP (last 3 results) No results for input(s): PROBNP in the last 8760 hours. HbA1C: No results for input(s): HGBA1C in the last 72 hours. CBG: No results for input(s): GLUCAP in the last 168 hours. Lipid Profile: No results for input(s): CHOL, HDL, LDLCALC, TRIG, CHOLHDL, LDLDIRECT in the last 72 hours. Thyroid Function Tests: No results for input(s): TSH, T4TOTAL, FREET4, T3FREE, THYROIDAB in the last 72 hours. Anemia Panel: No results for input(s): VITAMINB12, FOLATE, FERRITIN, TIBC, IRON, RETICCTPCT in the last 72 hours. Urine analysis:    Component Value  Date/Time   COLORURINE YELLOW 01/12/2019 1007   APPEARANCEUR CLEAR 01/12/2019 1007   LABSPEC 1.014 01/12/2019 1007   PHURINE 7.0 01/12/2019 1007   GLUCOSEU NEGATIVE 01/12/2019 1007   HGBUR NEGATIVE 01/12/2019 1007   BILIRUBINUR NEGATIVE 01/12/2019 1007   KETONESUR NEGATIVE 01/12/2019 1007   PROTEINUR NEGATIVE 01/12/2019 1007   NITRITE NEGATIVE 01/12/2019 1007   LEUKOCYTESUR NEGATIVE 01/12/2019 1007    Radiological Exams on Admission: Dg Chest 2 View  Result Date: 01/12/2019 CLINICAL DATA:  Altered mental status from nursing home. EXAM: CHEST - 2 VIEW COMPARISON:  October 13, 2018 FINDINGS: The heart size and mediastinal contours are within normal limits. Both lungs are clear. The visualized skeletal structures are unremarkable. IMPRESSION: No active cardiopulmonary disease. Electronically Signed   By: Abelardo Diesel M.D.   On: 01/12/2019 11:20   Ct Head Wo Contrast  Result Date: 01/12/2019 CLINICAL DATA:  Mental status changes. Altered level of consciousness. EXAM: CT HEAD WITHOUT CONTRAST TECHNIQUE: Contiguous axial images were obtained from the base of the skull through the vertex without intravenous contrast. COMPARISON:  None. FINDINGS: Brain: There is no evidence for acute hemorrhage, hydrocephalus, mass lesion, or abnormal extra-axial fluid collection. No definite CT evidence for acute infarction. Diffuse loss of parenchymal volume is consistent with atrophy. Patchy low attenuation in the deep hemispheric and periventricular white matter is nonspecific, but likely reflects chronic microvascular ischemic demyelination. Vascular: No hyperdense vessel or unexpected calcification. Skull: No evidence for fracture. No worrisome lytic or sclerotic lesion. Sinuses/Orbits: The visualized paranasal sinuses and mastoid air cells are clear. Visualized portions of the globes and intraorbital fat are unremarkable. Other: None. IMPRESSION: 1. No acute intracranial abnormality. 2. 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 Electronically Signed   By: Misty Stanley M.D.   On: 01/12/2019 11:00    EKG: Independently reviewed.  NSR, rate 52, no concerning ST elevation/depression, no T wave inversion, normal intervals, QTC 426  Assessment/Plan Principal Problem:   COVID-19 virus infection Active Problems:   Dementia without behavioral disturbance (HCC)   Dehydration   Acute metabolic encephalopathy Patient presenting from his SNF with reports of worsening mental status.  Etiology likely from underlying viral infection versus poor oral intake.  No electrolyte disturbances, urinalysis unremarkable, chest x-ray negative, CT head negative.  Ammonia level within normal limits.  Lactic acid normal, troponin negative.  Appears patient is now back at his baseline. --Admit to observation, [place] --Supportive care  COVID-19 viral infection Patient presenting from his nursing facility with reported poor oral intake over the past few days.  ED provider ordered COVID-19 test which was positive.  Patient is currently oxygenating well on room air, chest x-ray unrevealing.  EKG with normal QTC. --Continue to monitor oxygen status, currently on room air --Check LFTs, troponin, CRP, ferritin, d-dimer --Repeat EKG in a.m. --Supportive care --Contact/droplet isolation as patient is on room air --transfer to [place] as has no exclusion criteria  Dehydration Poor oral intake Patient with some mild dry mucous membranes on presentation.  Reported decreased oral intake over the past 2 days.  Likely related to viral infection as above. --Gentle IV fluid hydration for 24 hours --Repeat BMP and CBC in a.m. --Encourage increased oral intake  Dementia without behavioral disturbance --Continue home trazodone   DVT prophylaxis: Lovenox Code Status: Full code Family Communication: Attempted calling son via telephone, no answer Disposition Plan: Discharge back to [facility]  called: None Admission status: Observation, [facility]   Ryan Reaver J [place] DO [facility] Pager [phone]  If 7PM-7AM, please contact night-coverage [email or web address] Password [email or web address]  01/12/2019, 1:18 PM

## 2019-01-12 NOTE — ED Notes (Signed)
Patient transported to CT 

## 2019-01-12 NOTE — Progress Notes (Signed)
Family called and updated 

## 2019-01-12 NOTE — ED Notes (Signed)
This writer attempted to give report x 1. This writer will call back to give report in 20 minutes.

## 2019-01-12 NOTE — ED Notes (Signed)
Bed: WA20 Expected date:  Expected time:  Means of arrival:  Comments: EMS/h/a/dementia

## 2019-01-12 NOTE — Progress Notes (Signed)
This RN received a call from [facility] that the patient was off the monitor. When this RN walked into the room, the patient was standing at the bedside with everything removed except his gown. The patient was refusing to get back into the bed so MD was contacted. A safety sitter was ordered for safety due to the refusing of getting back into bed. He is normally very mobile at his SNF.

## 2019-01-12 NOTE — ED Notes (Addendum)
Patient is becoming increasingly agitated. Patient took out IV access. Patient will not allow this writer to start new IV site. Patient continues to pull at cords for monitoring of VS. Patient continues to get out of bed and not follow commands. This Probation officer will make MD aware.

## 2019-01-12 NOTE — ED Provider Notes (Signed)
[facility] DEPT Provider Note   CSN: [id number] Arrival date & time: 01/12/19  0277  History   Chief Complaint Dementia  HPI Ryan Pope is a 73 y.o. male with past medical history significant for dementia who presents for evaluation of AMS. Per facility patient has been sleeping more than normal over the last 2 days. Yesterday did not eat breakfast or dinner. He also complained of a HA yesterday.  History obtained from EMS and facility.  Consulted with patient's facility, [facility]. Spoke with nurse has been taking care of him this past week.  States he has been sleeping more over the past 2 days.  When a tech went to wake him up this morning they were unable to wake him up.  They called out to a nurse by the time the nurse got there the patient was able to be aroused, however the tech said that they called the patient's name multiple times and shook him and they were unable to wake him up. By the time EMS arrived patient was alert and at his baseline mentation. Nursing states patient is normally ambulatory and walking around however has not done this over the last 2 days. Has not had any cough, fever, SOB, diarrhea or emesis that the facility is aware of. No know COVID contacts. No new medication changes.  Level 5 caveat-- AMS  Full Code-- per EMS     HPI  Past Medical History:  Diagnosis Date   Asbestos exposure    BPH (benign prostatic hyperplasia)    ED (erectile dysfunction)    History of agent Orange exposure    History of low back pain    Hypercholesteremia    Prostate cancer ([name])    Recurrent inguinal hernia     Patient Active Problem List   Diagnosis Date Noted   COVID-19 virus infection 01/12/2019   Dementia without behavioral disturbance ([place]) 01/12/2019   Dehydration 01/12/2019    History reviewed. No pertinent surgical history.    Home Medications    Prior to Admission medications   Medication Sig  Start Date End Date Taking? Authorizing Provider  calcium carbonate (TUMS - DOSED IN MG ELEMENTAL CALCIUM) 500 MG chewable tablet Chew 1 tablet by mouth every 6 (six) hours as needed for indigestion or heartburn.   Yes [provider]  traZODone (DESYREL) 50 MG tablet Take 50 mg by mouth at bedtime. 12/20/18  Yes [provider]    Family History Family History  Problem Relation Age of Onset   Lung cancer Mother    Prostate cancer Maternal Uncle     Social History Social History   Tobacco Use   Smoking status: Never Smoker   Smokeless tobacco: Never Used  Substance Use Topics   Alcohol use: No    Alcohol/week: 0.0 standard drinks   Drug use: No     Allergies   Zoloft [sertraline hcl]   Review of Systems Review of Systems  Constitutional: Negative.   HENT: Negative.   Eyes: Negative.   Respiratory: Negative.   Cardiovascular: Negative.   Gastrointestinal: Negative.   Genitourinary: Negative.   Musculoskeletal: Negative.   Skin: Negative.   Neurological: Negative.   Hematological: Negative.   Psychiatric/Behavioral: Positive for confusion.  All other systems reviewed and are negative.  Physical Exam Updated Vital Signs BP (!) 133/98 (BP Location: Right Arm)    Pulse 98    Temp 97.8 F (36.6 C) (Rectal)    Resp 18  Ht 6' (1.829 m)    SpO2 96%    BMI 19.94 kg/m   Physical Exam Vitals signs and nursing note reviewed.  Constitutional:      General: He is not in acute distress.    Appearance: He is well-developed. He is not ill-appearing, toxic-appearing or diaphoretic.  HENT:     Head: Normocephalic and atraumatic.     Jaw: There is normal jaw occlusion.     Nose: Nose normal.     Mouth/Throat:     Lips: Pink.     Mouth: Mucous membranes are moist.     Pharynx: Oropharynx is clear. Uvula midline.     Tonsils: No tonsillar exudate or tonsillar abscesses.     Comments: Mucous membranes moist Eyes:     Pupils: Pupils are equal, round,  and reactive to light.     Comments: No nystagmus. PERLA  Neck:     Musculoskeletal: Full passive range of motion without pain, normal range of motion and neck supple.     Trachea: Trachea and phonation normal.     Comments: No neck stiffness or neck rigidity. Cardiovascular:     Rate and Rhythm: Normal rate and regular rhythm.     Pulses: Normal pulses.          Dorsalis pedis pulses are 2+ on the right side and 2+ on the left side.       Posterior tibial pulses are 2+ on the right side and 2+ on the left side.     Heart sounds: Normal heart sounds. No murmur. No friction rub. No gallop.   Pulmonary:     Effort: Pulmonary effort is normal. No respiratory distress.     Breath sounds: Normal breath sounds and air entry.     Comments: Clear to auscultation bilateral wheeze, rhonchi rales.  No accessory muscle usage.  Speaks in full sentences without difficulty. Chest:     Chest wall: No mass, deformity, swelling or tenderness.  Abdominal:     General: Bowel sounds are normal. There is no distension.     Palpations: Abdomen is soft.     Tenderness: There is no abdominal tenderness.     Hernia: No hernia is present.     Comments: Soft, Nontender without rebound or guarding.  No overlying skin changes to abdominal wall.  Normoactive bowel sounds.  Musculoskeletal: Normal range of motion.     Comments: Moves all 4 extremities without difficulty.  No lower extremity edema, erythema, ecchymosis or warmth.  Lymphadenopathy:     Cervical: No cervical adenopathy.  Skin:    General: Skin is warm and dry.     Comments: No rashes or lesions. Brisk cap refill  Neurological:     General: No focal deficit present.     Mental Status: He is alert. He is confused.     Motor: Motor function is intact.     Coordination: Romberg sign negative. Heel to [facility] Test normal.     Gait: Gait is intact.     Comments: Alert to person. Not place and time. 4+ strength bilaterally to Upper and lower extremities.  Negative pronator drift. Ambulatory in ED without difficulty. No facial droop. No dysphagia.     ED Treatments / Results  Labs (all labs ordered are listed, but only abnormal results are displayed) Labs Reviewed  SARS CORONAVIRUS 2 ([facility]) - Abnormal; Notable for the following components:      Result Value  SARS Coronavirus 2 POSITIVE (*)    All other components within normal limits  CBC WITH DIFFERENTIAL/PLATELET - Abnormal; Notable for the following components:   WBC 2.5 (*)    Neutro Abs 1.1 (*)    All other components within normal limits  COMPREHENSIVE METABOLIC PANEL - Abnormal; Notable for the following components:   Glucose, Bld 104 (*)    Calcium 8.8 (*)    GFR calc non Af Amer 59 (*)    All other components within normal limits  AMMONIA - Abnormal; Notable for the following components:   Ammonia <9 (*)    All other components within normal limits  CULTURE, BLOOD (ROUTINE X 2)  CULTURE, BLOOD (ROUTINE X 2)  URINE CULTURE  LACTIC ACID, PLASMA  URINALYSIS, ROUTINE W REFLEX MICROSCOPIC  TROPONIN I  RAPID URINE DRUG SCREEN, HOSP PERFORMED  LACTIC ACID, PLASMA    EKG None  Radiology Dg Chest 2 View  Result Date: 01/12/2019 CLINICAL DATA:  Altered mental status from nursing home. EXAM: CHEST - 2 VIEW COMPARISON:  October 13, 2018 FINDINGS: The heart size and mediastinal contours are within normal limits. Both lungs are clear. The visualized skeletal structures are unremarkable. IMPRESSION: No active cardiopulmonary disease. Electronically Signed   By: Abelardo Diesel M.D.   On: 01/12/2019 11:20   Ct Head Wo Contrast  Result Date: 01/12/2019 CLINICAL DATA:  Mental status changes. Altered level of consciousness. EXAM: CT HEAD WITHOUT CONTRAST TECHNIQUE: Contiguous axial images were obtained from the base of the skull through the vertex without intravenous contrast. COMPARISON:  None. FINDINGS: Brain: There is no evidence for  acute hemorrhage, hydrocephalus, mass lesion, or abnormal extra-axial fluid collection. No definite CT evidence for acute infarction. Diffuse loss of parenchymal volume is consistent with atrophy. Patchy low attenuation in the deep hemispheric and periventricular white matter is nonspecific, but likely reflects chronic microvascular ischemic demyelination. Vascular: No hyperdense vessel or unexpected calcification. Skull: No evidence for fracture. No worrisome lytic or sclerotic lesion. Sinuses/Orbits: The visualized paranasal sinuses and mastoid air cells are clear. Visualized portions of the globes and intraorbital fat are unremarkable. Other: None. IMPRESSION: 1. No acute intracranial abnormality. 2. 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 Electronically Signed   By: Misty Stanley M.D.   On: 01/12/2019 11:00    Procedures Procedures (including critical care time)  Medications Ordered in ED Medications - No data to display   Initial Impression / Assessment and Plan / ED Course  I have reviewed the triage vital signs and the nursing notes.  Pertinent labs & imaging results that were available during my care of the patient were reviewed by me and considered in my medical decision making (see chart for details).  73 year old with dementia at baseline who presents for evaluation of AMS. Afebrile, non septic, non ill appearing. Alert to person however not time and place. Lungs clear.  Moves all 4 extremities without difficulty.  Heart without murmurs, rubs or gallops.  No evidence of skin infections.  Brisk capillary refill.  He is neurovascularly intact. Facility states that he has not been "acting normal" over the last 2 days. No new medication changes. Plan for labs, imaging and reevaluate.  Imaging and labs personally reviewed.  Urinalysis negative for infection, UDS negative, Ammonia normal, CBC with leukopenia, CMP without electrolyte, renal or liver abnormalities.  Troponin negative, lactic acid 0.7, Chest xray without infiltrates, pulmonary edema, pneumothorax, cardiomegaly, CT head without acute findings. Low suspicion for [facility]. EKG without  ST/T changes.  COVID POSITIVE  Patient evaluated by attending provider Dr. Francia Greaves, recommend inpatient admission for AMS and COVID positive. Facility unable to take patient as he is positive. Feel that patient would benefit with trending of troponins given episode of unresponsiveness earlier this morning.  His first troponin was negative.  However given patient is demented he is not able to tell us if he has had any chest pain.  I have low suspicion that his headache was caused by subarachnoid hemorrhage.  He has no mass on his head CT.  He has no neck stiffness or neck rigidity to indicate meningitis.  He is afebrile here his lungs are clear his belly is soft he has no evidence of infectious process on exam. He is oriented to person, however not place and time.  Both with his facility nurse.  He did not have a spot for him as he is COVID positive.  Patient does not appear septic at this time.  Consulted with hospitalit Dr. [place] with [facility] who agrees to evaluate patient for admission for AMS, COVID positive and trending of Troponin.  1230: I have tried to reach family, Son Kolter Reaver III by telephone at [phone]) x 2. Unable to reach to update on status and get additional information.     Final Clinical Impressions(s) / ED Diagnoses   Final diagnoses:  Altered mental status, unspecified altered mental status type  COVID-19 virus infection    ED Discharge Orders    None       Levina Boyack A, PA-C 01/12/19 1329    Valarie Merino, MD 01/12/19 1450

## 2019-01-12 NOTE — ED Triage Notes (Addendum)
Patient arrived by EMS from [facility] Staff at facility stated that patient had a change of behavior. Staff stated he used to eat and talk. Pt is not not eating, talking, and not arousable. Staff stated this has been happening for the past 2 days. Pt c/o head hurting.   EMS VS BP 128/73, RR 16, SpO2 100% on RA, CBG 121, T 98.1 F, and HR 52.   Hx of Dementia.

## 2019-01-12 NOTE — ED Notes (Signed)
[name], RN notified that the patient was + for covid 19

## 2019-01-13 LAB — URINE CULTURE

## 2019-01-13 NOTE — Progress Notes (Signed)
CardioVascular Research Department and AHF Team  ReDS Research Project   Patient #: [id number]  ReDS Measurement  Right: 29 %  Left: 27 %

## 2019-01-13 NOTE — Progress Notes (Signed)
PROGRESS NOTE  Ryan Pope  MVE:[id number] DOB: 05-Dec-1945 DOA: 01/12/2019 PCP: Deland Pretty, MD   Brief Narrative: Ryan Pope is a 73 y.o. male with a history of remote prostate CA and advanced dementia who presented to the ED with reported altered mental status. He appeared disoriented, though per phone conversation with his daughter who cared for him full time up until 2 months ago he has returned to his mental baseline. Work up for encephalopathy was otherwise negative, though he was found to have covid-19 infection without evidence of inflammation, infiltrates on CXR, or respiratory symptoms. Because of this, [facility] reported they were unable to take the patient back, so he was admitted to [facility]. He remains completely stable without any acute medical needs and we are awaiting a safe discharge plan for him.    Assessment & Plan: Principal Problem:   COVID-19 virus infection Active Problems:   Dementia without behavioral disturbance ([place])   Dehydration  Covid-19 infection: Asymptomatic without infiltrates on CXR.  - Continue airborne, contact precautions. PPE including surgical gown, gloves, face shield, cap, shoe covers, and N-95 used during this encounter in a negative pressure room.  - Enoxaparin prophylactic dose. - Maintain euvolemia, avoid overload. - Avoid NSAIDs  Acute metabolic encephalopathy: Unclear etiology, though currently resolved. Negative acute CT head and other findings.   Dementia: Has been advancing dementia since about 2008, put into memory care due to wandering behavior/flight risk in March 2020.  - Delirium precautions, keep sitter at bedside.  Reported poor per oral intake: PO intake here has been good, no nausea, vomiting. Creatinine not elevated.  - Stop IVF, no further lab monitoring currently indicated.   DVT prophylaxis: Lovenox Code Status: Full Family Communication: Daughter by phone this morning Disposition Plan:  Uncertain, he is stable for discharge.  Consultants:   None  Procedures:   None  Antimicrobials:  None   Subjective: No complaints. Eating well, voiding. Had agitation that improved with sitter/redirection.   Objective: Vitals:   01/12/19 2012 01/12/19 2342 01/13/19 0407 01/13/19 0807  BP: 107/72 (!) 95/52 (!) 102/58 102/69  Pulse: 67 68 (!) 52 62  Resp: 15 16 14 16   Temp: 98.7 F (37.1 C) 97.9 F (36.6 C) 98.2 F (36.8 C) 98.6 F (37 C)  TempSrc: Oral Oral Oral Oral  SpO2: 99% 96% 99% 99%  Height:        Intake/Output Summary (Last 24 hours) at 01/13/2019 1127 Last data filed at 01/13/2019 1002 Gross per 24 hour  Intake 1291.48 ml  Output 500 ml  Net 791.48 ml   There were no vitals filed for this visit.  Gen: 73 y.o. male in no distress Pulm: Non-labored breathing room air. Clear to auscultation bilaterally.  CV: Regular rate and rhythm. No murmur, rub, or gallop. No JVD, no pedal edema. GI: Abdomen soft, non-tender, non-distended, with normoactive bowel sounds. No organomegaly or masses felt. Ext: Warm, no deformities Skin: No rashes, lesions ulcers Neuro: Alert and not oriented. No focal neurological deficits. Psych: Judgement and insight appear impaired. Mood & affect appropriate.   Data Reviewed: I have personally reviewed following labs and imaging studies  CBC: Recent Labs  Lab 01/12/19 0958 01/12/19 1915  WBC 2.5* 3.4*  NEUTROABS 1.1*  --   HGB 13.8 14.5  HCT 40.6 41.9  MCV 88.5 87.5  PLT 163 283   Basic Metabolic Panel: Recent Labs  Lab 01/12/19 0958 01/12/19 1915  NA 139  --   K  4.3  --   CL 104  --   CO2 28  --   GLUCOSE 104*  --   BUN 18  --   CREATININE 1.20 1.17  CALCIUM 8.8*  --   MG  --  2.4   GFR: CrCl cannot be calculated (Unknown ideal weight.). Liver Function Tests: Recent Labs  Lab 01/12/19 0958  AST 28  ALT 16  ALKPHOS 67  BILITOT 1.0  PROT 7.3  ALBUMIN 4.1   No results for input(s): LIPASE, AMYLASE in  the last 168 hours. Recent Labs  Lab 01/12/19 0959  AMMONIA <9*   Coagulation Profile: No results for input(s): INR, PROTIME in the last 168 hours. Cardiac Enzymes: Recent Labs  Lab 01/12/19 0958  TROPONINI <0.03   BNP (last 3 results) No results for input(s): PROBNP in the last 8760 hours. HbA1C: No results for input(s): HGBA1C in the last 72 hours. CBG: No results for input(s): GLUCAP in the last 168 hours. Lipid Profile: No results for input(s): CHOL, HDL, LDLCALC, TRIG, CHOLHDL, LDLDIRECT in the last 72 hours. Thyroid Function Tests: No results for input(s): TSH, T4TOTAL, FREET4, T3FREE, THYROIDAB in the last 72 hours. Anemia Panel: Recent Labs    01/12/19 1915  FERRITIN 247   Urine analysis:    Component Value Date/Time   COLORURINE YELLOW 01/12/2019 1007   APPEARANCEUR CLEAR 01/12/2019 1007   LABSPEC 1.014 01/12/2019 1007   PHURINE 7.0 01/12/2019 1007   GLUCOSEU NEGATIVE 01/12/2019 1007   HGBUR NEGATIVE 01/12/2019 1007   BILIRUBINUR NEGATIVE 01/12/2019 1007   KETONESUR NEGATIVE 01/12/2019 1007   PROTEINUR NEGATIVE 01/12/2019 1007   NITRITE NEGATIVE 01/12/2019 1007   LEUKOCYTESUR NEGATIVE 01/12/2019 1007   Recent Results (from the past 240 hour(s))  SARS Coronavirus 2 (CEPHEID - Performed in [facility] hospital lab), Hosp Order     Status: Abnormal   Collection Time: 01/12/19 10:05 AM  Result Value Ref Range Status   SARS Coronavirus 2 POSITIVE (A) NEGATIVE Corrected    Comment: RESULT CALLED TO, READ BACK BY AND VERIFIED WITH: BINGHAM,S. RN @1131  ON 5.20.20 BY NMCCOY (NOTE) If result is NEGATIVE SARS-CoV-2 target nucleic acids are NOT DETECTED. The SARS-CoV-2 RNA is generally detectable in upper and lower  respiratory specimens during the acute phase of infection. The lowest  concentration of SARS-CoV-2 viral copies this assay can detect is 250  copies / mL. A negative result does not preclude SARS-CoV-2 infection  and should not be used as the sole  basis for treatment or other  patient management decisions.  A negative result may occur with  improper specimen collection / handling, submission of specimen other  than nasopharyngeal swab, presence of viral mutation(s) within the  areas targeted by this assay, and inadequate number of viral copies  (<250 copies / mL). A negative result must be combined with clinical  observations, patient history, and epidemiological information. If result is POSITIVE SARS-CoV-2 target nucleic acids are DETECTE D. The SARS-CoV-2 RNA is generally detectable in upper and lower  respiratory specimens during the acute phase of infection.  Positive  results are indicative of active infection with SARS-CoV-2.  Clinical  correlation with patient history and other diagnostic information is  necessary to determine patient infection status.  Positive results do  not rule out bacterial infection or co-infection with other viruses. If result is PRESUMPTIVE POSTIVE SARS-CoV-2 nucleic acids MAY BE PRESENT.   A presumptive positive result was obtained on the submitted specimen  and confirmed on repeat testing.  While 2019 novel coronavirus  (SARS-CoV-2) nucleic acids may be present in the submitted sample  additional confirmatory testing may be necessary for epidemiological  and / or clinical management purposes  to differentiate between  SARS-CoV-2 and other Sarbecovirus currently known to infect humans.  If clinically indicated additional testing with an alternate test  methodology (LAB745 3) is advised. The SARS-CoV-2 RNA is generally  detectable in upper and lower respiratory specimens during the acute  phase of infection. The expected result is Negative. Fact Sheet for Patients:  StrictlyIdeas.no Fact Sheet for Healthcare Providers: BankingDealers.co.za This test is not yet approved or cleared by the [place] FDA and has been authorized for detection  and/or diagnosis of SARS-CoV-2 by FDA under an Emergency Use Authorization (EUA).  This EUA will remain in effect (meaning this test can be used) for the duration of the COVID-19 declaration under Section 564(b)(1) of the Act, 21 U.S.C. section 360bbb-3(b)(1), unless the authorization is terminated or revoked sooner. Performed at [facility], [place] [address] [place], [place] [address] CORRECTED ON 05/20 AT 1135: PREVIOUSLY REPORTED AS POSITIVE RESULT CALLED TO, READ BACK BY AND VERIFIED WITH: BINGHAM,S. RN @1131  ON 5.20.20 BY [facility]       Radiology Studies: Dg Chest 2 View  Result Date: 01/12/2019 CLINICAL DATA:  Altered mental status from nursing home. EXAM: CHEST - 2 VIEW COMPARISON:  October 13, 2018 FINDINGS: The heart size and mediastinal contours are within normal limits. Both lungs are clear. The visualized skeletal structures are unremarkable. IMPRESSION: No active cardiopulmonary disease. Electronically Signed   By: Abelardo Diesel M.D.   On: 01/12/2019 11:20   Ct Head Wo Contrast  Result Date: 01/12/2019 CLINICAL DATA:  Mental status changes. Altered level of consciousness. EXAM: CT HEAD WITHOUT CONTRAST TECHNIQUE: Contiguous axial images were obtained from the base of the skull through the vertex without intravenous contrast. COMPARISON:  None. FINDINGS: Brain: There is no evidence for acute hemorrhage, hydrocephalus, mass lesion, or abnormal extra-axial fluid collection. No definite CT evidence for acute infarction. Diffuse loss of parenchymal volume is consistent with atrophy. Patchy low attenuation in the deep hemispheric and periventricular white matter is nonspecific, but likely reflects chronic microvascular ischemic demyelination. Vascular: No hyperdense vessel or unexpected calcification. Skull: No evidence for fracture. No worrisome lytic or sclerotic lesion. Sinuses/Orbits: The visualized paranasal sinuses and mastoid air cells are clear. Visualized portions  of the globes and intraorbital fat are unremarkable. Other: None. IMPRESSION: 1. No acute intracranial abnormality. 2. 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 Electronically Signed   By: Misty Stanley M.D.   On: 01/12/2019 11:00    Scheduled Meds: . traZODone  50 mg Oral QHS   Continuous Infusions: . sodium chloride 100 mL/hr at 01/13/19 0501     LOS: 1 day   Time spent: 15 minutes.  Patrecia Pour, MD [facility] [email or web address] Password [facility] 01/13/2019, 11:27 AM

## 2019-01-13 NOTE — Progress Notes (Signed)
Safety sitter at bedside per order.

## 2019-01-13 NOTE — Progress Notes (Addendum)
Tele sitter arrived to floor, writer set up in room, report called to [place] Patient unable to follow commands, order for 1:1 sitter obtained by staff.

## 2019-01-13 NOTE — Progress Notes (Signed)
Updated daughter Vedia Pereyra over the phone.

## 2019-01-13 NOTE — Progress Notes (Signed)
Updated  given to [place] via phone.

## 2019-01-13 NOTE — Progress Notes (Addendum)
Engineer, manufacturing, patient removed his tele monitor, writer observed patient standing at bedside naked, staff unable to encourage patient to wear hospital gown. Patient attempting to remove the sheets from bed.   Patient allowed staff cover with gown. Safety sitter remains at bedside. Will continue to monitor.

## 2019-01-13 NOTE — Progress Notes (Signed)
Bedside shift report received, patient pacing in room at this time. Safety sitter remains at bedside.

## 2019-01-14 NOTE — Progress Notes (Addendum)
PROGRESS NOTE  [name]  TGG:[id number] DOB: [birth year] DOA: 01/12/2019 PCP: Deland Pretty, MD   Brief Narrative: Ryan Pope is a 73 y.o. male with a history of remote prostate CA and advanced dementia who presented to the ED with reported altered mental status. He appeared disoriented, though per phone conversation with his daughter who cared for him full time up until 2 months ago he has returned to his mental baseline. Work up for encephalopathy was otherwise negative, though he was found to have covid-19 infection without evidence of inflammation, infiltrates on CXR, or respiratory symptoms. Because of this, [facility] reported they were unable to take the patient back, so he was admitted to [facility]. He remains completely stable without any acute medical needs and we are awaiting a safe discharge plan for him.   Assessment & Plan: Principal Problem:   COVID-19 virus infection Active Problems:   Dementia without behavioral disturbance ([place])   Dehydration  Covid-19 infection: Asymptomatic without infiltrates on CXR. Inflammatory markers are not elevated. - Continue airborne, contact precautions while admitted. PPE including surgical gown, gloves, face shield, cap, shoe covers, and N-95 used during this encounter in a negative pressure room.  - Enoxaparin prophylactic dose. - Maintain euvolemia, avoid overload. - Avoid NSAIDs  Acute metabolic encephalopathy: Unclear etiology, though currently resolved. Negative acute CT head and other findings.   Dementia: Has been advancing dementia since about 2008, put into memory care due to wandering behavior/flight risk in March 2020.  - Delirium precautions, keep sitter at bedside.  Reported poor per oral intake: PO intake here has been good, no nausea, vomiting. Creatinine not elevated.  - Stop IVF, no further lab monitoring currently indicated.   DVT prophylaxis: Lovenox Code Status: Full Family Communication:  None at bedside. Called daughter without answer today. Daughter called back, spoke with her for about 15 minutes. She is very frustrated that her father is in the hospital without her being able to see him and that the facility told her they suspect the test result was a false positive. I shared with her that this is exceedingly unlikely. Disposition Plan: Uncertain, he is, and has been since arrival, stable for discharge. Unfortunately his facility is unequipped to care for covid patients. I spoke with Butch Penny, the Director of Nursing at the patient's facility who transferred me to the executive director who did not answer the call. I left a voicemail with my phone number and the number of the nurses statin, and am awaiting a call back from the Development worker, international aid at [facility]. We will pursue any options available for safe disposition.  Consultants:   None  Procedures:   None  Antimicrobials:  None   Subjective: No events overnight, continues to require sitter due to unfamiliar environment to maintain patient safety.   Objective: Vitals:   01/13/19 1159 01/13/19 1554 01/14/19 0051 01/14/19 0629  BP: 132/88 122/71 (!) 94/53 (!) 102/57  Pulse: 68 64 (!) 50 (!) 49  Resp: 18 16 16 15   Temp: 98.4 F (36.9 C) 97.7 F (36.5 C) 97.7 F (36.5 C) 97.6 F (36.4 C)  TempSrc: Oral Oral Oral Oral  SpO2: 98%     Height:        Intake/Output Summary (Last 24 hours) at 01/14/2019 1053 Last data filed at 01/13/2019 2116 Gross per 24 hour  Intake 999.72 ml  Output --  Net 999.72 ml   Gen: 73 y.o. male in no distress Pulm: Nonlabored breathing room air.  Clear. CV: Regular rate and rhythm. No murmur, rub, or gallop. No JVD, no dependent edema. GI: Abdomen soft, non-tender, non-distended, with normoactive bowel sounds.  Ext: Warm, no deformities Skin: No rashes, lesions or ulcers on visualized skin. Neuro: Alert, disoriented, verbal without aphasia, no focal neurological deficits. Psych:  Judgement and insight appear impaired.  Data Reviewed: I have personally reviewed following labs and imaging studies  CBC: Recent Labs  Lab 01/12/19 0958 01/12/19 1915  WBC 2.5* 3.4*  NEUTROABS 1.1*  --   HGB 13.8 14.5  HCT 40.6 41.9  MCV 88.5 87.5  PLT 163 478   Basic Metabolic Panel: Recent Labs  Lab 01/12/19 0958 01/12/19 1915  NA 139  --   K 4.3  --   CL 104  --   CO2 28  --   GLUCOSE 104*  --   BUN 18  --   CREATININE 1.20 1.17  CALCIUM 8.8*  --   MG  --  2.4   GFR: CrCl cannot be calculated (Unknown ideal weight.). Liver Function Tests: Recent Labs  Lab 01/12/19 0958  AST 28  ALT 16  ALKPHOS 67  BILITOT 1.0  PROT 7.3  ALBUMIN 4.1   No results for input(s): LIPASE, AMYLASE in the last 168 hours. Recent Labs  Lab 01/12/19 0959  AMMONIA <9*   Coagulation Profile: No results for input(s): INR, PROTIME in the last 168 hours. Cardiac Enzymes: Recent Labs  Lab 01/12/19 0958  TROPONINI <0.03   BNP (last 3 results) No results for input(s): PROBNP in the last 8760 hours. HbA1C: No results for input(s): HGBA1C in the last 72 hours. CBG: No results for input(s): GLUCAP in the last 168 hours. Lipid Profile: No results for input(s): CHOL, HDL, LDLCALC, TRIG, CHOLHDL, LDLDIRECT in the last 72 hours. Thyroid Function Tests: No results for input(s): TSH, T4TOTAL, FREET4, T3FREE, THYROIDAB in the last 72 hours. Anemia Panel: Recent Labs    01/12/19 1915  FERRITIN 247   Urine analysis:    Component Value Date/Time   COLORURINE YELLOW 01/12/2019 1007   APPEARANCEUR CLEAR 01/12/2019 1007   LABSPEC 1.014 01/12/2019 1007   PHURINE 7.0 01/12/2019 1007   GLUCOSEU NEGATIVE 01/12/2019 1007   HGBUR NEGATIVE 01/12/2019 1007   BILIRUBINUR NEGATIVE 01/12/2019 1007   KETONESUR NEGATIVE 01/12/2019 1007   PROTEINUR NEGATIVE 01/12/2019 1007   NITRITE NEGATIVE 01/12/2019 1007   LEUKOCYTESUR NEGATIVE 01/12/2019 1007   Recent Results (from the past 240  hour(s))  Blood culture (routine x 2)     Status: None (Preliminary result)   Collection Time: 01/12/19  9:58 AM  Result Value Ref Range Status   Specimen Description   Final    BLOOD RIGHT ANTECUBITAL Performed at [facility], [place] [address], [place], [place] [address]    Special Requests   Final    BOTTLES DRAWN AEROBIC AND ANAEROBIC Blood Culture adequate volume Performed at [facility] [address], [place], [place] [address]    Culture   Final    NO GROWTH 2 DAYS Performed at [facility] [address] [place], [place] [address]    Report Status PENDING  Incomplete  Blood culture (routine x 2)     Status: None (Preliminary result)   Collection Time: 01/12/19 10:03 AM  Result Value Ref Range Status   Specimen Description   Final    BLOOD LEFT ARM Performed at [facility] [address] [place], [place] [address]  Special Requests   Final    BOTTLES DRAWN AEROBIC AND ANAEROBIC Blood Culture results may not be optimal due to an excessive volume of blood received in culture bottles Performed at [facility] [address] [place], [place] [address]    Culture   Final    NO GROWTH 2 DAYS Performed at [facility] [address], [place], [place] [address]    Report Status PENDING  Incomplete  SARS Coronavirus 2 (CEPHEID - Performed in [facility] hospital lab), Hosp Order     Status: Abnormal   Collection Time: 01/12/19 10:05 AM  Result Value Ref Range Status   SARS Coronavirus 2 POSITIVE (A) NEGATIVE Corrected    Comment: RESULT CALLED TO, READ BACK BY AND VERIFIED WITH: BINGHAM,S. RN @1131  ON 5.20.20 BY NMCCOY (NOTE) If result is NEGATIVE SARS-CoV-2 target nucleic acids are NOT DETECTED. The SARS-CoV-2 RNA is generally detectable in upper and lower  respiratory specimens during the acute phase of infection. The lowest  concentration of SARS-CoV-2 viral copies this  assay can detect is 250  copies / mL. A negative result does not preclude SARS-CoV-2 infection  and should not be used as the sole basis for treatment or other  patient management decisions.  A negative result may occur with  improper specimen collection / handling, submission of specimen other  than nasopharyngeal swab, presence of viral mutation(s) within the  areas targeted by this assay, and inadequate number of viral copies  (<250 copies / mL). A negative result must be combined with clinical  observations, patient history, and epidemiological information. If result is POSITIVE SARS-CoV-2 target nucleic acids are DETECTE D. The SARS-CoV-2 RNA is generally detectable in upper and lower  respiratory specimens during the acute phase of infection.  Positive  results are indicative of active infection with SARS-CoV-2.  Clinical  correlation with patient history and other diagnostic information is  necessary to determine patient infection status.  Positive results do  not rule out bacterial infection or co-infection with other viruses. If result is PRESUMPTIVE POSTIVE SARS-CoV-2 nucleic acids MAY BE PRESENT.   A presumptive positive result was obtained on the submitted specimen  and confirmed on repeat testing.  While 2019 novel coronavirus  (SARS-CoV-2) nucleic acids may be present in the submitted sample  additional confirmatory testing may be necessary for epidemiological  and / or clinical management purposes  to differentiate between  SARS-CoV-2 and other Sarbecovirus currently known to infect humans.  If clinically indicated additional testing with an alternate test  methodology (LAB745 3) is advised. The SARS-CoV-2 RNA is generally  detectable in upper and lower respiratory specimens during the acute  phase of infection. The expected result is Negative. Fact Sheet for Patients:  StrictlyIdeas.no Fact Sheet for Healthcare  Providers: BankingDealers.co.za This test is not yet approved or cleared by the [place] FDA and has been authorized for detection and/or diagnosis of SARS-CoV-2 by FDA under an Emergency Use Authorization (EUA).  This EUA will remain in effect (meaning this test can be used) for the duration of the COVID-19 declaration under Section 564(b)(1) of the Act, 21 U.S.C. section 360bbb-3(b)(1), unless the authorization is terminated or revoked sooner. Performed at [facility], [place] [address], [place], [place] [address] CORRECTED ON 05/20 AT 1135: PREVIOUSLY REPORTED AS POSITIVE RESULT CALLED TO, READ BACK BY AND VERIFIED WITH: BINGHAM,S. RN @1131  ON 5.20.20 BY NMCCOY   Urine culture     Status: Abnormal   Collection  Time: 01/12/19 10:07 AM  Result Value Ref Range Status   Specimen Description   Final    URINE, RANDOM Performed at [facility] [address], [place], [place] [address]    Special Requests   Final    NONE Performed at [facility], [place] [address], [place],  [address]    Culture MULTIPLE SPECIES PRESENT, SUGGEST RECOLLECTION (A)  Final   Report Status 01/13/2019 FINAL  Final      Radiology Studies: Dg Chest 2 View  Result Date: 01/12/2019 CLINICAL DATA:  Altered mental status from nursing home. EXAM: CHEST - 2 VIEW COMPARISON:  October 13, 2018 FINDINGS: The heart size and mediastinal contours are within normal limits. Both lungs are clear. The visualized skeletal structures are unremarkable. IMPRESSION: No active cardiopulmonary disease. Electronically Signed   By: Abelardo Diesel M.D.   On: 01/12/2019 11:20    Scheduled Meds:  traZODone  50 mg Oral QHS   Continuous Infusions:    LOS: 2 days   Time spent: 15 minutes.  Patrecia Pour, MD [facility] [email or web address] Password [facility] 01/14/2019, 10:53 AM

## 2019-01-14 NOTE — Progress Notes (Addendum)
CSW confirmed with [facility] they are unable to take patient back until he tests negative. The test must also be a normal test and not a rapid, MD reports medically there is no reason for patient to be at [facility] at this time. CSW attempted to explain this to [facility], still refusing to accept back without negatives.   CSW will continue to work on discharge planning options for patient.  CSW has lvm with Director of [place] at [facility] to ask for plan for their resident since they are refusing to take back. Awaiting response.   [place], [place]

## 2019-01-14 NOTE — TOC Initial Note (Signed)
Transition of Care [facility]) - Initial/Assessment Note    Patient Details  Name: Ryan Pope MRN: [id number] Date of Birth: 09-07-1945  [facility] of [facility]) CM/SW Contact:    Alberteen Sam, [place] Phone Number: [phone] 01/14/2019, 10:27 AM  Clinical Narrative:                  CSW consulted with patient's daughter [place] regarding discharge planning. Vedia Pereyra expressed frustrations as she has attempted to contact [facility] and reports she is upset they are reporting they will not take patient back. CSW informed Vedia Pereyra that [facility] has notified CSW patient will need 2 negatives before returning. [place] asked of any other facilities patient can be transferred to before testing negative, would like referral to [facility] to be sent as backup option. Vedia Pereyra reports [facility] is requesting patient be tested for COVID by a test that takes 3-4 days as facility reports some rapid tests can be misguided. CSW will request from MD and continue to follow for test results to determine discharge plan. If test is negative in 3 days, patient will need a 2nd test before returning to [facility].   Expected Discharge Plan: Assisted Living Barriers to Discharge: Other (comment)(facility is not accepting patient back until 2 negative COVID tests)   Patient Goals and CMS Choice   CMS Medicare.gov Compare Post Acute Care list provided to:: Patient Represenative (must comment)(Inga (daughter)) Choice offered to / list presented to : Adult Children(Inga)  Expected Discharge Plan and Services Expected Discharge Plan: Assisted Living     Post Acute Care Choice: (ALF) Living arrangements for the past 2 months: Assisted Living Facility([facility]) Expected Discharge Date: (unknown)                                    Prior Living Arrangements/Services Living arrangements for the past 2 months: Assisted Living Facility([facility]) Lives with:: Self Patient language  and need for interpreter reviewed:: Yes        Need for Family Participation in Patient Care: Yes (Comment) Care giver support system in place?: Yes (comment)   Criminal Activity/Legal Involvement Pertinent to Current Situation/Hospitalization: No - Comment as needed  Activities of Daily Living Home Assistive Devices/Equipment: Eyeglasses, Grab bars around toilet, Grab bars in shower, Hand-held shower hose, Blood pressure cuff, Hospital bed([facility] has necessary equipment for thier residents) ADL Screening (condition at time of admission) Patient's cognitive ability adequate to safely complete daily activities?: No Is the patient deaf or have difficulty hearing?: Yes Does the patient have difficulty seeing, even when wearing glasses/contacts?: No Does the patient have difficulty concentrating, remembering, or making decisions?: Yes Patient able to express need for assistance with ADLs?: No Does the patient have difficulty dressing or bathing?: Yes Independently performs ADLs?: No Communication: Independent Dressing (OT): Dependent Is this a change from baseline?: Change from baseline, expected to last >3 days Grooming: Dependent Is this a change from baseline?: Change from baseline, expected to last >3 days Feeding: Dependent Is this a change from baseline?: Change from baseline, expected to last >3 days Bathing: Dependent Is this a change from baseline?: Change from baseline, expected to last >3 days Toileting: Dependent Is this a change from baseline?: Change from baseline, expected to last >3days In/Out Bed: Dependent Is this a change from baseline?: Change from baseline, expected to last >3 days Walks in Home: Dependent Is this a change from  baseline?: Change from baseline, expected to last >3 days Does the patient have difficulty walking or climbing stairs?: Yes Weakness of Legs: Both Weakness of Arms/Hands: Both  Permission Sought/Granted Permission sought to share  information with : Case Manager, Customer service manager, Family Supports Permission granted to share information with : Yes, Verbal Permission Granted  Share Information with NAME: Vedia Pereyra  Permission granted to share info w AGENCY: SNFs  Permission granted to share info w Relationship: daughter  Permission granted to share info w Contact Information: [phone]  Emotional Assessment Appearance:: Other (Comment Required(unable to assess - remote) Attitude/Demeanor/Rapport: Unable to Assess Affect (typically observed): Unable to Assess Orientation: : Oriented to Self Alcohol / Substance Use: Not Applicable Psych Involvement: No (comment)  Admission diagnosis:  Altered mental status, unspecified altered mental status type [R41.82] COVID-19 virus infection [U07.1] Patient Active Problem List   Diagnosis Date Noted  . COVID-19 virus infection 01/12/2019  . Dementia without behavioral disturbance ([place]) 01/12/2019  . Dehydration 01/12/2019   PCP:  Deland Pretty, MD Pharmacy:   [facility] [place], [place] [address] Phone: [phone] Fax: [phone]     Social Determinants of Health (SDOH) Interventions    Readmission Risk Interventions No flowsheet data found.

## 2019-01-14 NOTE — Progress Notes (Signed)
[place] and AHF Team  ReDS Research Project   Patient #: [id number]  ReDS Measurement  Right: 29 %  Left: pt refused

## 2019-01-14 NOTE — Progress Notes (Signed)
[facility] RN called for update on patient. Discussed current care plan and pt's assessment as of today

## 2019-01-14 NOTE — Progress Notes (Addendum)
Bed alarm audible, patient standing beside the bed. Staff unable to reoriented/ redirect patient, patient pacing in room, remaking bed,looking under bed,  Attempting to pull cords out of  electric outlets asking how to drive it out of the building. Patient requires constant Network engineer will remain with patient until safety sitter arrives.   2320 Safety sitter arrived to bedside. Bedside report given.  Will continue to monitor.

## 2019-01-15 LAB — GLUCOSE, CAPILLARY
Glucose-Capillary: 117 mg/dL — ABNORMAL HIGH (ref 70–99)
Glucose-Capillary: 117 mg/dL — ABNORMAL HIGH (ref 70–99)

## 2019-01-15 NOTE — Progress Notes (Signed)
[facility] Research Department and AHF Team  ReDS Research Project   Patient #: [id number]  ReDS Measurement  Right: 24 %  Left: 28 %

## 2019-01-15 NOTE — Discharge Summary (Signed)
Physician Discharge Summary  Ryan Pope [id number] DOB: 04/16/1946 DOA: 01/12/2019  PCP: Deland Pretty, MD  Admit date: 01/12/2019 Discharge date: 01/15/2019  Admitted From: [facility] Disposition: [facility]   Recommendations for Outpatient Follow-up:  1. Follow up with PCP in 1-2 weeks  Home Health: N/A Equipment/Devices: Per [facility] Discharge Condition: Stable CODE STATUS: Full Diet recommendation: As tolerated  Brief/Interim Summary: Ryan Pope is a 73 y.o. male with a history of remote prostate CA and advanced dementia who presented to the ED with reported altered mental status. He appeared disoriented, though per phone conversation with his daughter who cared for him full time up until 2 months ago he has returned to his mental baseline. Work up for encephalopathy was otherwise negative, though he was found to have covid-19 infection without evidence of inflammation, infiltrates on CXR, or respiratory symptoms. Because of this, [facility] reported they were unable to take the patient back, so he was admitted to [facility]. He remains completely stable without any acute medical needs and we are awaiting a safe discharge plan for him.   Discharge Diagnoses:  Principal Problem:   COVID-19 virus infection Active Problems:   Dementia without behavioral disturbance ([place])   Dehydration  Covid-19 infection: Asymptomatic without infiltrates on CXR. Inflammatory markers are not elevated. - Continue droplet, contact precautions.  - Avoid NSAIDs  Acute metabolic encephalopathy: Unclear etiology, though currently resolved. Negative acute CT head and other findings.   Dementia: Has been advancing dementia since about 2008, put into memory care due to wandering behavior/flight risk in March 2020.  - Delirium precautions - Continue home medication  Reported poor per oral intake: PO intake here has been good, no nausea, vomiting. Creatinine  not elevated.  - Stop IVF, no further lab monitoring currently indicated.   Discharge Instructions  Allergies as of 01/15/2019      Reactions   Zoloft [sertraline Hcl]       Medication List    TAKE these medications   calcium carbonate 500 MG chewable tablet Commonly known as:  TUMS - dosed in mg elemental calcium Chew 1 tablet by mouth every 6 (six) hours as needed for indigestion or heartburn.   traZODone 50 MG tablet Commonly known as:  DESYREL Take 50 mg by mouth at bedtime.       Allergies  Allergen Reactions  . Zoloft [Sertraline Hcl]     Consultations:  None  Procedures/Studies: Dg Chest 2 View  Result Date: 01/12/2019 CLINICAL DATA:  Altered mental status from nursing home. EXAM: CHEST - 2 VIEW COMPARISON:  October 13, 2018 FINDINGS: The heart size and mediastinal contours are within normal limits. Both lungs are clear. The visualized skeletal structures are unremarkable. IMPRESSION: No active cardiopulmonary disease. Electronically Signed   By: Abelardo Diesel M.D.   On: 01/12/2019 11:20   Ct Head Wo Contrast  Result Date: 01/12/2019 CLINICAL DATA:  Mental status changes. Altered level of consciousness. EXAM: CT HEAD WITHOUT CONTRAST TECHNIQUE: Contiguous axial images were obtained from the base of the skull through the vertex without intravenous contrast. COMPARISON:  None. FINDINGS: Brain: There is no evidence for acute hemorrhage, hydrocephalus, mass lesion, or abnormal extra-axial fluid collection. No definite CT evidence for acute infarction. Diffuse loss of parenchymal volume is consistent with atrophy. Patchy low attenuation in the deep hemispheric and periventricular white matter is nonspecific, but likely reflects chronic microvascular ischemic demyelination. Vascular: No hyperdense vessel or unexpected calcification. Skull: No evidence for fracture. No  worrisome lytic or sclerotic lesion. Sinuses/Orbits: The visualized paranasal sinuses and mastoid air cells  are clear. Visualized portions of the globes and intraorbital fat are unremarkable. Other: None. IMPRESSION: 1. No acute intracranial abnormality. 2. 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 Electronically Signed   By: Misty Stanley M.D.   On: 01/12/2019 11:00      Subjective: Feels well, no new complaints. No dyspnea and no chest pain.  Discharge Exam: Vitals:   01/14/19 2311 01/15/19 0442  BP: 130/71 120/64  Pulse: 62 (!) 49  Resp:  18  Temp: 98.3 F (36.8 C) 98.4 F (36.9 C)  SpO2: 97% 97%   General: Pt is alert, awake, not in acute distress Cardiovascular: RRR, S1/S2 +, no rubs, no gallops Respiratory: CTA bilaterally, no wheezing, no rhonchi Abdominal: Soft, NT, ND, bowel sounds + Extremities: No edema, no cyanosis  Labs: BNP (last 3 results) No results for input(s): BNP in the last 8760 hours. Basic Metabolic Panel: Recent Labs  Lab 01/12/19 0958 01/12/19 1915  NA 139  --   K 4.3  --   CL 104  --   CO2 28  --   GLUCOSE 104*  --   BUN 18  --   CREATININE 1.20 1.17  CALCIUM 8.8*  --   MG  --  2.4   Liver Function Tests: Recent Labs  Lab 01/12/19 0958  AST 28  ALT 16  ALKPHOS 67  BILITOT 1.0  PROT 7.3  ALBUMIN 4.1   No results for input(s): LIPASE, AMYLASE in the last 168 hours. Recent Labs  Lab 01/12/19 0959  AMMONIA <9*   CBC: Recent Labs  Lab 01/12/19 0958 01/12/19 1915  WBC 2.5* 3.4*  NEUTROABS 1.1*  --   HGB 13.8 14.5  HCT 40.6 41.9  MCV 88.5 87.5  PLT 163 174   Cardiac Enzymes: Recent Labs  Lab 01/12/19 0958  TROPONINI <0.03   BNP: Invalid input(s): POCBNP CBG: No results for input(s): GLUCAP in the last 168 hours. D-Dimer Recent Labs    01/12/19 1915  DDIMER 0.50   Hgb A1c No results for input(s): HGBA1C in the last 72 hours. Lipid Profile No results for input(s): CHOL, HDL, LDLCALC, TRIG, CHOLHDL, LDLDIRECT in the last 72 hours. Thyroid function studies No results for input(s): TSH,  T4TOTAL, T3FREE, THYROIDAB in the last 72 hours.  Invalid input(s): FREET3 Anemia work up Recent Labs    01/12/19 1915  FERRITIN 247   Urinalysis    Component Value Date/Time   COLORURINE YELLOW 01/12/2019 [place] 01/12/2019 1007   LABSPEC 1.014 01/12/2019 1007   PHURINE 7.0 01/12/2019 1007   GLUCOSEU NEGATIVE 01/12/2019 1007   HGBUR NEGATIVE 01/12/2019 1007   BILIRUBINUR NEGATIVE 01/12/2019 1007   KETONESUR NEGATIVE 01/12/2019 1007   PROTEINUR NEGATIVE 01/12/2019 1007   NITRITE NEGATIVE 01/12/2019 [place] 01/12/2019 1007    Microbiology Recent Results (from the past 240 hour(s))  Blood culture (routine x 2)     Status: None (Preliminary result)   Collection Time: 01/12/19  9:58 AM  Result Value Ref Range Status   Specimen Description   Final    BLOOD RIGHT ANTECUBITAL Performed at [facility], [place] [address] [place], [place] [address]    Special Requests   Final    BOTTLES DRAWN AEROBIC AND ANAEROBIC Blood Culture adequate volume Performed at [facility] [address], [place],  [address]    Culture  Final    NO GROWTH 2 DAYS Performed at [facility], [place] [address], [place], [place] [address]    Report Status PENDING  Incomplete  Blood culture (routine x 2)     Status: None (Preliminary result)   Collection Time: 01/12/19 10:03 AM  Result Value Ref Range Status   Specimen Description   Final    BLOOD LEFT ARM Performed at [facility] [address], [place], [place] [address]    Special Requests   Final    BOTTLES DRAWN AEROBIC AND ANAEROBIC Blood Culture results may not be optimal due to an excessive volume of blood received in culture bottles Performed at [facility] [address], [place], [place] [address]    Culture   Final    NO GROWTH 2 DAYS Performed at [facility] [address] [place], [place]  [address]    Report Status PENDING  Incomplete  SARS Coronavirus 2 (CEPHEID - Performed in [place] hospital lab), Hosp Order     Status: Abnormal   Collection Time: 01/12/19 10:05 AM  Result Value Ref Range Status   SARS Coronavirus 2 POSITIVE (A) NEGATIVE Corrected    Comment: RESULT CALLED TO, READ BACK BY AND VERIFIED WITH: BINGHAM,S. RN @1131  ON 5.20.20 BY NMCCOY (NOTE) If result is NEGATIVE SARS-CoV-2 target nucleic acids are NOT DETECTED. The SARS-CoV-2 RNA is generally detectable in upper and lower  respiratory specimens during the acute phase of infection. The lowest  concentration of SARS-CoV-2 viral copies this assay can detect is 250  copies / mL. A negative result does not preclude SARS-CoV-2 infection  and should not be used as the sole basis for treatment or other  patient management decisions.  A negative result may occur with  improper specimen collection / handling, submission of specimen other  than nasopharyngeal swab, presence of viral mutation(s) within the  areas targeted by this assay, and inadequate number of viral copies  (<250 copies / mL). A negative result must be combined with clinical  observations, patient history, and epidemiological information. If result is POSITIVE SARS-CoV-2 target nucleic acids are DETECTE D. The SARS-CoV-2 RNA is generally detectable in upper and lower  respiratory specimens during the acute phase of infection.  Positive  results are indicative of active infection with SARS-CoV-2.  Clinical  correlation with patient history and other diagnostic information is  necessary to determine patient infection status.  Positive results do  not rule out bacterial infection or co-infection with other viruses. If result is PRESUMPTIVE POSTIVE SARS-CoV-2 nucleic acids MAY BE PRESENT.   A presumptive positive result was obtained on the submitted specimen  and confirmed on repeat testing.  While 2019 novel coronavirus  (SARS-CoV-2) nucleic  acids may be present in the submitted sample  additional confirmatory testing may be necessary for epidemiological  and / or clinical management purposes  to differentiate between  SARS-CoV-2 and other Sarbecovirus currently known to infect humans.  If clinically indicated additional testing with an alternate test  methodology (LAB745 3) is advised. The SARS-CoV-2 RNA is generally  detectable in upper and lower respiratory specimens during the acute  phase of infection. The expected result is Negative. Fact Sheet for Patients:  StrictlyIdeas.no Fact Sheet for Healthcare Providers: BankingDealers.co.za This test is not yet approved or cleared by the [place] FDA and has been authorized for detection and/or diagnosis of SARS-CoV-2 by FDA under an Emergency Use Authorization (EUA).  This EUA will  remain in effect (meaning this test can be used) for the duration of the COVID-19 declaration under Section 564(b)(1) of the Act, 21 U.S.C. section 360bbb-3(b)(1), unless the authorization is terminated or revoked sooner. Performed at [facility], [place] [address] [place], [place] [address] CORRECTED ON 05/20 AT 1135: PREVIOUSLY REPORTED AS POSITIVE RESULT CALLED TO, READ BACK BY AND VERIFIED WITH: BINGHAM,S. RN @1131  ON 5.20.20 BY NMCCOY   Urine culture     Status: Abnormal   Collection Time: 01/12/19 10:07 AM  Result Value Ref Range Status   Specimen Description   Final    URINE, RANDOM Performed at [facility], [place] [address], [place], [place] [address]    Special Requests   Final    NONE Performed at [facility], [place] [address], [place], [place] [address]    Culture MULTIPLE SPECIES PRESENT, SUGGEST RECOLLECTION (A)  Final   Report Status 01/13/2019 FINAL  Final    Time coordinating discharge: Approximately 40 minutes  Patrecia Pour, MD  [facility] 01/15/2019, 8:06  AM

## 2019-01-15 NOTE — TOC Progression Note (Signed)
Transition of Care [facility]) - Progression Note    Patient Details  Name: Ryan Pope MRN: [id number] Date of Birth: 04/23/1946  Transition [facility] CM/SW Contact  Jaye Saal, Francetta Found, LCSW Phone Number: 01/15/2019, 3:50 PM  Clinical Narrative:     Clinical Social Worker continuing to follow patient and family for support and discharge planning needs.  Patient is supposed to return to [facility] with a 24 hour sitter, however no sitter lined up by facility as of this time.  CSW spoke with Development worker, international aid who is hopeful for an agency agreement by tomorrow 5/24.  CSW provided information to DSS [employer]) per the request of weekday CSW for potential facility citation.  CSW updated all parties including MD, RN, and family of patient delay in discharge.  CSW remains available for support and to facilitate patient discharge needs.  Expected Discharge Plan: Assisted Living Barriers to Discharge: Other (comment)(facility is not accepting patient back until 2 negative COVID tests)  Expected Discharge Plan and Services Expected Discharge Plan: Assisted Living     Post Acute Care Choice: (ALF) Living arrangements for the past 2 months: Assisted Living Facility([facility]) Expected Discharge Date: 01/15/19                                     Social Determinants of Health (SDOH) Interventions    Readmission Risk Interventions No flowsheet data found.

## 2019-01-16 DIAGNOSIS — F0390 Unspecified dementia without behavioral disturbance: Secondary | ICD-10-CM

## 2019-01-16 NOTE — Progress Notes (Addendum)
PROGRESS NOTE                                                                                                                                                                                                             Patient Demographics:    Ryan Pope, is a 73 y.o. male, DOB - Jun 07, 1946, WER:[id number]  Outpatient Primary MD for the patient is Deland Pretty, MD    LOS - 4  Chief Complaint  Patient presents with  . Failure To Thrive       Brief Narrative: Patient is a 73 y.o. male with PMHx of with history of advanced dementia presented to the hospital with altered mental status-upon further evaluation he was found to have COVID-19 infection without any evidence of pulmonary symptoms or infiltrates on chest x-ray.   Subjective:    Ryan Pope today remains pleasantly confused.  Per nursing staff no major events overnight.   Assessment  & Plan :   COVID-19 infection: Appears asymptomatic-chest x-ray without infiltrates, inflammatory markers are not elevated.  Continue supportive care  Acute metabolic encephalopathy: Currently was more confused than usual baseline on initial presentation to the hospital-has resolved with just supportive care.  Unclear etiology-nonacute CT head.  No evidence of any other infection.  Dementia: Appears to be advanced-at risk of delirium during this hospital stay.  Continue supportive care.  COVID-19 Labs:  No results for input(s): DDIMER, FERRITIN, LDH, CRP in the last 72 hours.  Lab Results  Component Value Date   SARSCOV2NAA POSITIVE (A) 01/12/2019     COVID-19 Medications: None  ABG: No results found for: PHART, PCO2ART, PO2ART, HCO3, TCO2, ACIDBASEDEF, O2SAT  Condition - Stable  Family Communication  : None at bedside  Code Status :  Full Code  Diet : Regular diet  Disposition Plan  :  Remain inpatient-stable for discharge to SNF if bed available today   Consults  : None  Procedures  :  None  DVT Prophylaxis  :  SCDs   Lab Results  Component Value Date   PLT 174 01/12/2019    Inpatient Medications  Scheduled Meds: . traZODone  50 mg Oral QHS   Continuous Infusions: PRN Meds:.acetaminophen, calcium carbonate, ondansetron **OR** ondansetron (ZOFRAN) IV, senna-docusate  Antibiotics  :    Anti-infectives (From admission, onward)   None  Time Spent in minutes  25   Oren Binet M.D on 01/16/2019 at 3:10 PM  To page go to [email or web address] - use universal password  [facility] -  Office  [phone]  See all Orders from today for further details   Admit date - 01/12/2019    4    Objective:   Vitals:   01/16/19 0405 01/16/19 0755 01/16/19 1000 01/16/19 1241  BP: 112/64 119/70  119/70  Pulse: 67 (!) 57 62 67  Resp:  13  18  Temp: 97.9 F (36.6 C) (!) 97.5 F (36.4 C)  98.1 F (36.7 C)  TempSrc: Oral Oral  Oral  SpO2: 97% 98%  99%  Weight:      Height:        Wt Readings from Last 3 Encounters:  01/14/19 49.8 kg  09/25/15 66.7 kg     Intake/Output Summary (Last 24 hours) at 01/16/2019 1510 Last data filed at 01/16/2019 1300 Gross per 24 hour  Intake 956 ml  Output -  Net 956 ml     Physical Exam Gen Exam:awake-pleasantly confused-not in any distress HEENT:atraumatic, normocephalic Chest: B/L clear to auscultation anteriorly CVS:S1S2 regular Abdomen:soft non tender, non distended Extremities:no edema Neurology: Non focal Skin: no rash   Data Review:    CBC Recent Labs  Lab 01/12/19 0958 01/12/19 1915  WBC 2.5* 3.4*  HGB 13.8 14.5  HCT 40.6 41.9  PLT 163 174  MCV 88.5 87.5  MCH 30.1 30.3  MCHC 34.0 34.6  RDW 12.6 12.6  LYMPHSABS 1.1  --   MONOABS 0.3  --   EOSABS 0.1  --   BASOSABS 0.0  --     Chemistries  Recent Labs  Lab 01/12/19 0958 01/12/19 1915  NA 139  --   K 4.3  --   CL 104  --   CO2 28  --   GLUCOSE 104*  --   BUN 18  --   CREATININE 1.20 1.17   CALCIUM 8.8*  --   MG  --  2.4  AST 28  --   ALT 16  --   ALKPHOS 67  --   BILITOT 1.0  --    ------------------------------------------------------------------------------------------------------------------ No results for input(s): CHOL, HDL, LDLCALC, TRIG, CHOLHDL, LDLDIRECT in the last 72 hours.  No results found for: HGBA1C ------------------------------------------------------------------------------------------------------------------ No results for input(s): TSH, T4TOTAL, T3FREE, THYROIDAB in the last 72 hours.  Invalid input(s): FREET3 ------------------------------------------------------------------------------------------------------------------ No results for input(s): VITAMINB12, FOLATE, FERRITIN, TIBC, IRON, RETICCTPCT in the last 72 hours.  Coagulation profile No results for input(s): INR, PROTIME in the last 168 hours.  No results for input(s): DDIMER in the last 72 hours.  Cardiac Enzymes Recent Labs  Lab 01/12/19 0958  TROPONINI <0.03   ------------------------------------------------------------------------------------------------------------------ No results found for: BNP  Micro Results Recent Results (from the past 240 hour(s))  Blood culture (routine x 2)     Status: None (Preliminary result)   Collection Time: 01/12/19  9:58 AM  Result Value Ref Range Status   Specimen Description   Final    BLOOD RIGHT ANTECUBITAL Performed at [facility] [address] [place], [place] [address]    Special Requests   Final    BOTTLES DRAWN AEROBIC AND ANAEROBIC Blood Culture adequate volume Performed at [facility] [address] [place], [place] [address]    Culture   Final    NO GROWTH 4 DAYS Performed at [facility], [place] [address],  [place], [place] [address]    Report Status PENDING  Incomplete  Blood culture (routine x 2)     Status: None (Preliminary result)   Collection Time: 01/12/19 10:03 AM   Result Value Ref Range Status   Specimen Description   Final    BLOOD LEFT ARM Performed at [facility] [address] [place], [place] [address]    Special Requests   Final    BOTTLES DRAWN AEROBIC AND ANAEROBIC Blood Culture results may not be optimal due to an excessive volume of blood received in culture bottles Performed at [facility] [address] [place], [place] [address]    Culture   Final    NO GROWTH 4 DAYS Performed at [facility], [place] [address], [place], [place] [address]    Report Status PENDING  Incomplete  SARS Coronavirus 2 (CEPHEID - Performed in [facility] hospital lab), Hosp Order     Status: Abnormal   Collection Time: 01/12/19 10:05 AM  Result Value Ref Range Status   SARS Coronavirus 2 POSITIVE (A) NEGATIVE Corrected    Comment: RESULT CALLED TO, READ BACK BY AND VERIFIED WITH: BINGHAM,S. RN @1131  ON 5.20.20 BY NMCCOY (NOTE) If result is NEGATIVE SARS-CoV-2 target nucleic acids are NOT DETECTED. The SARS-CoV-2 RNA is generally detectable in upper and lower  respiratory specimens during the acute phase of infection. The lowest  concentration of SARS-CoV-2 viral copies this assay can detect is 250  copies / mL. A negative result does not preclude SARS-CoV-2 infection  and should not be used as the sole basis for treatment or other  patient management decisions.  A negative result may occur with  improper specimen collection / handling, submission of specimen other  than nasopharyngeal swab, presence of viral mutation(s) within the  areas targeted by this assay, and inadequate number of viral copies  (<250 copies / mL). A negative result must be combined with clinical  observations, patient history, and epidemiological information. If result is POSITIVE SARS-CoV-2 target nucleic acids are DETECTE D. The SARS-CoV-2 RNA is generally detectable in upper and lower  respiratory specimens during the acute  phase of infection.  Positive  results are indicative of active infection with SARS-CoV-2.  Clinical  correlation with patient history and other diagnostic information is  necessary to determine patient infection status.  Positive results do  not rule out bacterial infection or co-infection with other viruses. If result is PRESUMPTIVE POSTIVE SARS-CoV-2 nucleic acids MAY BE PRESENT.   A presumptive positive result was obtained on the submitted specimen  and confirmed on repeat testing.  While 2019 novel coronavirus  (SARS-CoV-2) nucleic acids may be present in the submitted sample  additional confirmatory testing may be necessary for epidemiological  and / or clinical management purposes  to differentiate between  SARS-CoV-2 and other Sarbecovirus currently known to infect humans.  If clinically indicated additional testing with an alternate test  methodology (LAB745 3) is advised. The SARS-CoV-2 RNA is generally  detectable in upper and lower respiratory specimens during the acute  phase of infection. The expected result is Negative. Fact Sheet for Patients:  StrictlyIdeas.no Fact Sheet for Healthcare Providers: BankingDealers.co.za This test is not yet approved or cleared by the [place] FDA and has been authorized for detection and/or diagnosis of SARS-CoV-2 by FDA under an Emergency Use Authorization (EUA).  This EUA will remain in effect (meaning this test can be used) for the duration of the COVID-19 declaration under Section  564(b)(1) of the Act, 21 U.S.C. section 360bbb-3(b)(1), unless the authorization is terminated or revoked sooner. Performed at [facility], [place] [address] [place], [place] [address] CORRECTED ON 05/20 AT 1135: PREVIOUSLY REPORTED AS POSITIVE RESULT CALLED TO, READ BACK BY AND VERIFIED WITH: BINGHAM,S. RN @1131  ON 5.20.20 BY NMCCOY   Urine culture     Status: Abnormal   Collection  Time: 01/12/19 10:07 AM  Result Value Ref Range Status   Specimen Description   Final    URINE, RANDOM Performed at [facility], [place] [address], [place], [place] [address]    Special Requests   Final    NONE Performed at [facility], [place] [address], [place], [place] [address]    Culture MULTIPLE SPECIES PRESENT, SUGGEST RECOLLECTION (A)  Final   Report Status 01/13/2019 FINAL  Final    Radiology Reports Dg Chest 2 View  Result Date: 01/12/2019 CLINICAL DATA:  Altered mental status from nursing home. EXAM: CHEST - 2 VIEW COMPARISON:  October 13, 2018 FINDINGS: The heart size and mediastinal contours are within normal limits. Both lungs are clear. The visualized skeletal structures are unremarkable. IMPRESSION: No active cardiopulmonary disease. Electronically Signed   By: Abelardo Diesel M.D.   On: 01/12/2019 11:20   Ct Head Wo Contrast  Result Date: 01/12/2019 CLINICAL DATA:  Mental status changes. Altered level of consciousness. EXAM: CT HEAD WITHOUT CONTRAST TECHNIQUE: Contiguous axial images were obtained from the base of the skull through the vertex without intravenous contrast. COMPARISON:  None. FINDINGS: Brain: There is no evidence for acute hemorrhage, hydrocephalus, mass lesion, or abnormal extra-axial fluid collection. No definite CT evidence for acute infarction. Diffuse loss of parenchymal volume is consistent with atrophy. Patchy low attenuation in the deep hemispheric and periventricular white matter is nonspecific, but likely reflects chronic microvascular ischemic demyelination. Vascular: No hyperdense vessel or unexpected calcification. Skull: No evidence for fracture. No worrisome lytic or sclerotic lesion. Sinuses/Orbits: The visualized paranasal sinuses and mastoid air cells are clear. Visualized portions of the globes and intraorbital fat are unremarkable. Other: None. IMPRESSION: 1. No acute intracranial abnormality. 2. Atrophy with  chronic small-vessel white matter ischemic disease. Electronically Signed   By: Misty Stanley M.D.   On: 01/12/2019 11:00

## 2019-01-17 LAB — CULTURE, BLOOD (ROUTINE X 2)
Culture: NO GROWTH
Culture: NO GROWTH
Special Requests: ADEQUATE

## 2019-01-17 NOTE — Progress Notes (Signed)
Card[facility] Research Department and AHF Team  ReDS Research Project   Patient #: [id number]  ReDS Measurement  Right: 25 %  Left: 29 %

## 2019-01-17 NOTE — TOC Progression Note (Signed)
Transition of Care [facility]) - Progression Note    Patient Details  Name: Ryan Pope MRN: [id number] Date of Birth: 11-22-1945  Transition of Care [facility]) CM/SW [place], [place] Phone Number: 01/17/2019, 1:35 PM  Clinical Narrative:    Spoke with Executive Director Pam Drown at [facility], facility still unable to find any additional sitter services until Wednesday, she has them arranged through [facility] [place] [facility].   Updated MD and CSW Surveyor, quantity.   Expected Discharge Plan: Assisted Living Barriers to Discharge: Other (comment)(facility is not accepting patient back until 2 negative COVID tests)  Expected Discharge Plan and Services Expected Discharge Plan: Assisted Living     Post Acute Care Choice: (ALF) Living arrangements for the past 2 months: Assisted Living Facility([facility]) Expected Discharge Date: 01/17/19                                     Social Determinants of Health (SDOH) Interventions    Readmission Risk Interventions No flowsheet data found.

## 2019-01-17 NOTE — Progress Notes (Signed)
Is up ambulating in room.   Talking about issues with cars in room.  Confused but pleasant at this time.  Sitter at bedside.

## 2019-01-17 NOTE — Progress Notes (Signed)
PROGRESS NOTE                                                                                                                                                                                                             Patient Demographics:    Ryan Pope, is a 73 y.o. male, DOB - Jan 19, 1946, ERD:[id number]  Outpatient Primary MD for the patient is Deland Pretty, MD    LOS - 5  Chief Complaint  Patient presents with  . Failure To Thrive       Brief Narrative: Patient is a 73 y.o. male with PMHx of with history of advanced dementia presented to the hospital with altered mental status-upon further evaluation he was found to have COVID-19 infection without any evidence of pulmonary symptoms or infiltrates on chest x-ray.   Subjective:    Ryan Pope today remains pleasantly confused.  Per nursing staff no major events overnight.   Assessment  & Plan :   COVID-19 infection: Appears asymptomatic-chest x-ray without infiltrates, inflammatory markers are not elevated.  Continue supportive care  Acute metabolic encephalopathy: Currently was more confused than usual baseline on initial presentation to the hospital-has resolved with just supportive care.  Unclear etiology-nonacute CT head.  No evidence of any other infection.  Dementia: Appears to be advanced-at risk of delirium during this hospital stay.  Continue supportive care.  COVID-19 Labs:  No results for input(s): DDIMER, FERRITIN, LDH, CRP in the last 72 hours.  Lab Results  Component Value Date   SARSCOV2NAA POSITIVE (A) 01/12/2019     COVID-19 Medications: None  ABG: No results found for: PHART, PCO2ART, PO2ART, HCO3, TCO2, ACIDBASEDEF, O2SAT  Condition - Stable  Family Communication  : None at bedside  Code Status :  Full Code  Diet : Regular diet  Disposition Plan  :  Remain inpatient-spoke to social work-back to SNF on Wednesday (5/27)   Consults  : None  Procedures  :  None  DVT Prophylaxis  :  SCDs   Lab Results  Component Value Date   PLT 174 01/12/2019    Inpatient Medications  Scheduled Meds: . traZODone  50 mg Oral QHS   Continuous Infusions: PRN Meds:.acetaminophen, calcium carbonate, ondansetron **OR** ondansetron (ZOFRAN) IV, senna-docusate  Antibiotics  :    Anti-infectives (From admission, onward)   None  Time Spent in minutes  15   Oren Binet M.D on 01/17/2019 at 11:59 AM  To page go to [email or web address] - use universal password  [facility] -  Office  [phone]  See all Orders from today for further details   Admit date - 01/12/2019    5    Objective:   Vitals:   01/16/19 1940 01/16/19 2300 01/17/19 0310 01/17/19 0800  BP: 125/75 123/85 122/86   Pulse: (!) 59 71 65   Resp: 16 18 20    Temp: 97.9 F (36.6 C) 97.8 F (36.6 C) 97.9 F (36.6 C) 97.9 F (36.6 C)  TempSrc: Oral Oral Oral Oral  SpO2: 97% 97% 98%   Weight:      Height:        Wt Readings from Last 3 Encounters:  01/14/19 49.8 kg  09/25/15 66.7 kg     Intake/Output Summary (Last 24 hours) at 01/17/2019 1159 Last data filed at 01/16/2019 1800 Gross per 24 hour  Intake 836 ml  Output -  Net 836 ml     Physical Exam Gen Exam:awake-pleasantly confused-not in any distress HEENT:atraumatic, normocephalic Chest: B/L clear to auscultation anteriorly CVS:S1S2 regular Abdomen:soft non tender, non distended Extremities:no edema Neurology: Non focal Skin: no rash   Data Review:    CBC Recent Labs  Lab 01/12/19 0958 01/12/19 1915  WBC 2.5* 3.4*  HGB 13.8 14.5  HCT 40.6 41.9  PLT 163 174  MCV 88.5 87.5  MCH 30.1 30.3  MCHC 34.0 34.6  RDW 12.6 12.6  LYMPHSABS 1.1  --   MONOABS 0.3  --   EOSABS 0.1  --   BASOSABS 0.0  --     Chemistries  Recent Labs  Lab 01/12/19 0958 01/12/19 1915  NA 139  --   K 4.3  --   CL 104  --   CO2 28  --   GLUCOSE 104*  --   BUN 18  --    CREATININE 1.20 1.17  CALCIUM 8.8*  --   MG  --  2.4  AST 28  --   ALT 16  --   ALKPHOS 67  --   BILITOT 1.0  --    ------------------------------------------------------------------------------------------------------------------ No results for input(s): CHOL, HDL, LDLCALC, TRIG, CHOLHDL, LDLDIRECT in the last 72 hours.  No results found for: HGBA1C ------------------------------------------------------------------------------------------------------------------ No results for input(s): TSH, T4TOTAL, T3FREE, THYROIDAB in the last 72 hours.  Invalid input(s): FREET3 ------------------------------------------------------------------------------------------------------------------ No results for input(s): VITAMINB12, FOLATE, FERRITIN, TIBC, IRON, RETICCTPCT in the last 72 hours.  Coagulation profile No results for input(s): INR, PROTIME in the last 168 hours.  No results for input(s): DDIMER in the last 72 hours.  Cardiac Enzymes Recent Labs  Lab 01/12/19 0958  TROPONINI <0.03   ------------------------------------------------------------------------------------------------------------------ No results found for: BNP  Micro Results Recent Results (from the past 240 hour(s))  Blood culture (routine x 2)     Status: None   Collection Time: 01/12/19  9:58 AM  Result Value Ref Range Status   Specimen Description   Final    BLOOD RIGHT ANTECUBITAL Performed at [facility] [address] [place], [place] [address]    Special Requests   Final    BOTTLES DRAWN AEROBIC AND ANAEROBIC Blood Culture adequate volume Performed at [facility] [address], [place], [place] [address]    Culture   Final    NO GROWTH 5 DAYS Performed at [facility], [place] [address]  [place], [place] [address]    Report Status 01/17/2019 FINAL  Final  Blood culture (routine x 2)     Status: None   Collection Time: 01/12/19 10:03 AM  Result Value Ref  Range Status   Specimen Description   Final    BLOOD LEFT ARM Performed at [facility] [address] [place], [place] [address]    Special Requests   Final    BOTTLES DRAWN AEROBIC AND ANAEROBIC Blood Culture results may not be optimal due to an excessive volume of blood received in culture bottles Performed at [facility] [address], [place], [place] [address]    Culture   Final    NO GROWTH 5 DAYS Performed at [facility], [place] [address], [place], [place] [address]    Report Status 01/17/2019 FINAL  Final  SARS Coronavirus 2 (CEPHEID - Performed in [facility] hospital lab), Hosp Order     Status: Abnormal   Collection Time: 01/12/19 10:05 AM  Result Value Ref Range Status   SARS Coronavirus 2 POSITIVE (A) NEGATIVE Corrected    Comment: RESULT CALLED TO, READ BACK BY AND VERIFIED WITH: BINGHAM,S. RN @1131  ON 5.20.20 BY NMCCOY (NOTE) If result is NEGATIVE SARS-CoV-2 target nucleic acids are NOT DETECTED. The SARS-CoV-2 RNA is generally detectable in upper and lower  respiratory specimens during the acute phase of infection. The lowest  concentration of SARS-CoV-2 viral copies this assay can detect is 250  copies / mL. A negative result does not preclude SARS-CoV-2 infection  and should not be used as the sole basis for treatment or other  patient management decisions.  A negative result may occur with  improper specimen collection / handling, submission of specimen other  than nasopharyngeal swab, presence of viral mutation(s) within the  areas targeted by this assay, and inadequate number of viral copies  (<250 copies / mL). A negative result must be combined with clinical  observations, patient history, and epidemiological information. If result is POSITIVE SARS-CoV-2 target nucleic acids are DETECTE D. The SARS-CoV-2 RNA is generally detectable in upper and lower  respiratory specimens during the acute phase of  infection.  Positive  results are indicative of active infection with SARS-CoV-2.  Clinical  correlation with patient history and other diagnostic information is  necessary to determine patient infection status.  Positive results do  not rule out bacterial infection or co-infection with other viruses. If result is PRESUMPTIVE POSTIVE SARS-CoV-2 nucleic acids MAY BE PRESENT.   A presumptive positive result was obtained on the submitted specimen  and confirmed on repeat testing.  While 2019 novel coronavirus  (SARS-CoV-2) nucleic acids may be present in the submitted sample  additional confirmatory testing may be necessary for epidemiological  and / or clinical management purposes  to differentiate between  SARS-CoV-2 and other Sarbecovirus currently known to infect humans.  If clinically indicated additional testing with an alternate test  methodology (LAB745 3) is advised. The SARS-CoV-2 RNA is generally  detectable in upper and lower respiratory specimens during the acute  phase of infection. The expected result is Negative. Fact Sheet for Patients:  StrictlyIdeas.no Fact Sheet for Healthcare Providers: BankingDealers.co.za This test is not yet approved or cleared by the [place] FDA and has been authorized for detection and/or diagnosis of SARS-CoV-2 by FDA under an Emergency Use Authorization (EUA).  This EUA will remain in effect (meaning this test can be used) for the duration of the COVID-19 declaration under Section  564(b)(1) of the Act, 21 U.S.C. section 360bbb-3(b)(1), unless the authorization is terminated or revoked sooner. Performed at [facility], [place] [address] [place], [place] [address] CORRECTED ON 05/20 AT 1135: PREVIOUSLY REPORTED AS POSITIVE RESULT CALLED TO, READ BACK BY AND VERIFIED WITH: BINGHAM,S. RN @1131  ON 5.20.20 BY NMCCOY   Urine culture     Status: Abnormal   Collection Time:  01/12/19 10:07 AM  Result Value Ref Range Status   Specimen Description   Final    URINE, RANDOM Performed at [facility], [place] [address] [place], [place] [address]    Special Requests   Final    NONE Performed at [facility], [place] [address] [place], [place] [address]    Culture MULTIPLE SPECIES PRESENT, SUGGEST RECOLLECTION (A)  Final   Report Status 01/13/2019 FINAL  Final    Radiology Reports Dg Chest 2 View  Result Date: 01/12/2019 CLINICAL DATA:  Altered mental status from nursing home. EXAM: CHEST - 2 VIEW COMPARISON:  October 13, 2018 FINDINGS: The heart size and mediastinal contours are within normal limits. Both lungs are clear. The visualized skeletal structures are unremarkable. IMPRESSION: No active cardiopulmonary disease. Electronically Signed   By: Abelardo Diesel M.D.   On: 01/12/2019 11:20   Ct Head Wo Contrast  Result Date: 01/12/2019 CLINICAL DATA:  Mental status changes. Altered level of consciousness. EXAM: CT HEAD WITHOUT CONTRAST TECHNIQUE: Contiguous axial images were obtained from the base of the skull through the vertex without intravenous contrast. COMPARISON:  None. FINDINGS: Brain: There is no evidence for acute hemorrhage, hydrocephalus, mass lesion, or abnormal extra-axial fluid collection. No definite CT evidence for acute infarction. Diffuse loss of parenchymal volume is consistent with atrophy. Patchy low attenuation in the deep hemispheric and periventricular white matter is nonspecific, but likely reflects chronic microvascular ischemic demyelination. Vascular: No hyperdense vessel or unexpected calcification. Skull: No evidence for fracture. No worrisome lytic or sclerotic lesion. Sinuses/Orbits: The visualized paranasal sinuses and mastoid air cells are clear. Visualized portions of the globes and intraorbital fat are unremarkable. Other: None. IMPRESSION: 1. No acute intracranial abnormality. 2. 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 Electronically Signed   By: Misty Stanley M.D.   On: 01/12/2019 11:00

## 2019-01-18 DIAGNOSIS — R4182 Altered mental status, unspecified: Secondary | ICD-10-CM

## 2019-01-18 LAB — GLUCOSE, CAPILLARY: Glucose-Capillary: 121 mg/dL — ABNORMAL HIGH (ref 70–99)

## 2019-01-18 NOTE — Discharge Instructions (Signed)
Person Under Monitoring Name: Ryan Pope  Location: [place] [address]   Infection Prevention Recommendations for Individuals Confirmed to have, or Being Evaluated for, 2019 Novel Coronavirus (COVID-19) Infection Who Receive Care at Home  Individuals who are confirmed to have, or are being evaluated for, COVID-19 should follow the prevention steps below until a healthcare provider or local or state health department says they can return to normal activities.  Stay home except to get medical care You should restrict activities outside your home, except for getting medical care. Do not go to work, school, or public areas, and do not use public transportation or taxis.  Call ahead before visiting your doctor Before your medical appointment, call the healthcare provider and tell them that you have, or are being evaluated for, COVID-19 infection. This will help the healthcare providers office take steps to keep other people from getting infected. Ask your healthcare provider to call the local or state health department.  Monitor your symptoms Seek prompt medical attention if your illness is worsening (e.g., difficulty breathing). Before going to your medical appointment, call the healthcare provider and tell them that you have, or are being evaluated for, COVID-19 infection. Ask your healthcare provider to call the local or state health department.  Wear a facemask You should wear a facemask that covers your nose and mouth when you are in the same room with other people and when you visit a healthcare provider. People who live with or visit you should also wear a facemask while they are in the same room with you.  Separate yourself from other people in your home As much as possible, you should stay in a different room from other people in your home. Also, you should use a separate bathroom, if available.  Avoid sharing household items You should not  share dishes, drinking glasses, cups, eating utensils, towels, bedding, or other items with other people in your home. After using these items, you should wash them thoroughly with soap and water.  Cover your coughs and sneezes Cover your mouth and nose with a tissue when you cough or sneeze, or you can cough or sneeze into your sleeve. Throw used tissues in a lined trash can, and immediately wash your hands with soap and water for at least 20 seconds or use an alcohol-based hand rub.  Wash your [facility] your hands often and thoroughly with soap and water for at least 20 seconds. You can use an alcohol-based hand sanitizer if soap and water are not available and if your hands are not visibly dirty. Avoid touching your eyes, nose, and mouth with unwashed hands.   Prevention Steps for Caregivers and Household Members of Individuals Confirmed to have, or Being Evaluated for, COVID-19 Infection Being Cared for in the Home  If you live with, or provide care at home for, a person confirmed to have, or being evaluated for, COVID-19 infection please follow these guidelines to prevent infection:  Follow healthcare providers instructions Make sure that you understand and can help the patient follow any healthcare provider instructions for all care.  Provide for the patients basic needs You should help the patient with basic needs in the home and provide support for getting groceries, prescriptions, and other personal needs.  Monitor the patients symptoms If they are getting sicker, call his or her medical provider and tell them that the patient has, or is being evaluated for, COVID-19 infection. This will help the healthcare providers office  take steps to keep other people from getting infected. Ask the healthcare provider to call the local or state health department.  Limit the number of people who have contact with the patient  If possible, have only one caregiver for the  patient.  Other household members should stay in another home or place of residence. If this is not possible, they should stay  in another room, or be separated from the patient as much as possible. Use a separate bathroom, if available.  Restrict visitors who do not have an essential need to be in the home.  Keep older adults, very young children, and other sick people away from the patient Keep older adults, very young children, and those who have compromised immune systems or chronic health conditions away from the patient. This includes people with chronic heart, lung, or kidney conditions, diabetes, and cancer.  Ensure good ventilation Make sure that shared spaces in the home have good air flow, such as from an air conditioner or an opened window, weather permitting.  Wash your hands often  Wash your hands often and thoroughly with soap and water for at least 20 seconds. You can use an alcohol based hand sanitizer if soap and water are not available and if your hands are not visibly dirty.  Avoid touching your eyes, nose, and mouth with unwashed hands.  Use disposable paper towels to dry your hands. If not available, use dedicated cloth towels and replace them when they become wet.  Wear a facemask and gloves  Wear a disposable facemask at all times in the room and gloves when you touch or have contact with the patients blood, body fluids, and/or secretions or excretions, such as sweat, saliva, sputum, nasal mucus, vomit, urine, or feces.  Ensure the mask fits over your nose and mouth tightly, and do not touch it during use.  Throw out disposable facemasks and gloves after using them. Do not reuse.  Wash your hands immediately after removing your facemask and gloves.  If your personal clothing becomes contaminated, carefully remove clothing and launder. Wash your hands after handling contaminated clothing.  Place all used disposable facemasks, gloves, and other waste in a lined  container before disposing them with other household waste.  Remove gloves and wash your hands immediately after handling these items.  Do not share dishes, glasses, or other household items with the patient  Avoid sharing household items. You should not share dishes, drinking glasses, cups, eating utensils, towels, bedding, or other items with a patient who is confirmed to have, or being evaluated for, COVID-19 infection.  After the person uses these items, you should wash them thoroughly with soap and water.  Wash laundry thoroughly  Immediately remove and wash clothes or bedding that have blood, body fluids, and/or secretions or excretions, such as sweat, saliva, sputum, nasal mucus, vomit, urine, or feces, on them.  Wear gloves when handling laundry from the patient.  Read and follow directions on labels of laundry or clothing items and detergent. In general, wash and dry with the warmest temperatures recommended on the label.  Clean all areas the individual has used often  Clean all touchable surfaces, such as counters, tabletops, doorknobs, bathroom fixtures, toilets, phones, keyboards, tablets, and bedside tables, every day. Also, clean any surfaces that may have blood, body fluids, and/or secretions or excretions on them.  Wear gloves when cleaning surfaces the patient has come in contact with.  Use a diluted bleach solution (e.g., dilute bleach with 1 part  bleach and 10 parts water) or a household disinfectant with a label that says EPA-registered for coronaviruses. To make a bleach solution at home, add 1 tablespoon of bleach to 1 quart (4 cups) of water. For a larger supply, add  cup of bleach to 1 gallon (16 cups) of water.  Read labels of cleaning products and follow recommendations provided on product labels. Labels contain instructions for safe and effective use of the cleaning product including precautions you should take when applying the product, such as wearing gloves or  eye protection and making sure you have good ventilation during use of the product.  Remove gloves and wash hands immediately after cleaning.  Monitor yourself for signs and symptoms of illness Caregivers and household members are considered close contacts, should monitor their health, and will be asked to limit movement outside of the home to the extent possible. Follow the monitoring steps for close contacts listed on the symptom monitoring form.   ? If you have additional questions, contact your local health department or call the epidemiologist on call at [phone] (available 24/7). ? This guidance is subject to change. For the most up-to-date guidance from [facility], please refer to their website: YouBlogs.pl      Person Under Monitoring Name: Ryan Pope  Location: [place] [address]   CORONAVIRUS DISEASE 2019 (COVID-19) Guidance for Persons Under Investigation You are being tested for the virus that causes coronavirus disease 2019 (COVID-19). Public health actions are necessary to ensure protection of your health and the health of others, and to prevent further spread of infection. COVID-19 is caused by a virus that can cause symptoms, such as fever, cough, and shortness of breath. The primary transmission from person to person is by coughing or sneezing. On September 23, 2018, the [place] announced a [employer] Emergency of International Concern and on September 24, 2018 the U.S. Department of Health and Human Services declared a public health emergency. If the virus that causesCOVID-19 spreads in the community, it could have severe public health consequences.  As a person under investigation for COVID-19, the [place] advises you to adhere to the following guidance until your test results are reported to  you. If your test result is positive, you will receive additional information from your provider and your local health department at that time.   Remain at home until you are cleared by your health provider or public health authorities.   Keep a log of visitors to your home using the form provided. Any visitors to your home must be aware of your isolation status.  If you plan to move to a new address or leave the county, notify the local health department in your county.  Call a doctor or seek care if you have an urgent medical need. Before seeking medical care, call ahead and get instructions from the provider before arriving at the medical office, clinic or hospital. Notify them that you are being tested for the virus that causes COVID-19 so arrangements can be made, as necessary, to prevent transmission to others in the healthcare setting. Next, notify the local health department in your county.  If a medical emergency arises and you need to call 911, inform the first responders that you are being tested for the virus that causes COVID-19. Next, notify the local health department in your county.  Adhere to all guidance set forth by the [facility]  of Public Health for Home Care of patients that is based on guidance from the Center for Disease Control and Prevention with suspected or confirmed COVID-19. It is provided with this guidance for Persons Under Investigation.  Your health and the health of our community are our top priorities. Public Health officials remain available to provide assistance and counseling to you about COVID-19 and compliance with this guidance.  Provider: ____________________________________________________________ Date: ______/_____/_________  By signing below, you acknowledge that you have read and agree to comply with this Guidance for Persons Under Investigation. ______________________________________________________________ Date:  ______/_____/_________  WHO DO I CALL? You can find a list of local health departments here: [email or web address] Health Department: ____________________________________________________________________ Contact Name: ________________________________________________________________________ Telephone: ___________________________________________________________________________  Marice Potter, [place], Communicable Disease Branch COVID-19 Guidance for Persons Under Investigation October 30, 2018

## 2019-01-18 NOTE — Discharge Summary (Addendum)
PATIENT DETAILS Name: Ryan Pope Age: 73 y.o. Sex: male Date of Birth: [birth year] MRN: [id number]. Admitting Physician: Eric J [place] DO CVE:LFYBO, Thayer Jew, MD  Admit Date: 01/12/2019 Discharge date: 01/19/2019  Recommendations for Outpatient Follow-up:  1. Follow up with PCP in 1-2 weeks 2. Please obtain BMP/CBC in one week   Admitted From:  ALF  Disposition: ALF   Home Health: Yes  Equipment/Devices: None  Discharge Condition: Stable  CODE STATUS: Full Code  Diet recommendation:  Heart Healthy  Brief Summary: See H&P, Labs, Consult and Test reports for all details in brief, Patient is a 73 y.o. male with PMHx of with history of advanced dementia presented to the hospital with altered mental status-upon further evaluation he was found to have COVID-19 infection without any evidence of pulmonary symptoms or infiltrates on chest x-ray.   Brief Hospital Course: COVID-19 infection: Appears asymptomatic-chest x-ray without infiltrates, inflammatory markers are not elevated.  Continue supportive care  Acute metabolic encephalopathy: Currently was more confused than usual baseline on initial presentation to the hospital-has resolved with just supportive care.  Unclear etiology-nonacute CT head.  No evidence of any other infection.  Dementia: Appears to be advanced-at risk of delirium during this hospital stay.  Continue supportive care-been resumed on his usual medications.  Per social work-due to delirium-ALF will arrange for a bedside sitter.  Stable for discharge when bed available.  Procedures/Studies: None  Discharge Diagnoses:  Principal Problem:   COVID-19 virus infection Active Problems:   Dementia without behavioral disturbance ([place])   Dehydration   Discharge Instructions:  Activity:  As tolerated   Discharge Instructions    Diet - low sodium heart healthy   Complete by:  As directed    Discharge instructions   Complete by:  As  directed    Follow with Primary MD  Deland Pretty, MD in 1 week  Please get a complete blood count and chemistry panel checked by your Primary MD at your next visit, and again as instructed by your Primary MD.  Get Medicines reviewed and adjusted: Please take all your medications with you for your next visit with your Primary MD  Laboratory/radiological data: Please request your Primary MD to go over all hospital tests and procedure/radiological results at the follow up, please ask your Primary MD to get all Hospital records sent to his/her office.  In some cases, they will be blood work, cultures and biopsy results pending at the time of your discharge. Please request that your primary care M.D. follows up on these results.  Also Note the following: If you experience worsening of your admission symptoms, develop shortness of breath, life threatening emergency, suicidal or homicidal thoughts you must seek medical attention immediately by calling 911 or calling your MD immediately  if symptoms less severe.  You must read complete instructions/literature along with all the possible adverse reactions/side effects for all the Medicines you take and that have been prescribed to you. Take any new Medicines after you have completely understood and accpet all the possible adverse reactions/side effects.   Do not drive when taking Pain medications or sleeping medications (Benzodaizepines)  Do not take more than prescribed Pain, Sleep and Anxiety Medications. It is not advisable to combine anxiety,sleep and pain medications without talking with your primary care practitioner  Special Instructions: If you have smoked or chewed Tobacco  in the last 2 yrs please stop smoking, stop any regular Alcohol  and or any Recreational drug use.  Wear Seat belts  while driving.  Please note: You were cared for by a hospitalist during your hospital stay. Once you are discharged, your primary care physician will  handle any further medical issues. Please note that NO REFILLS for any discharge medications will be authorized once you are discharged, as it is imperative that you return to your primary care physician (or establish a relationship with a primary care physician if you do not have one) for your post hospital discharge needs so that they can reassess your need for medications and monitor your lab values.  ?   Person Under Monitoring Name: Ryan Pope  Location: [place] [address]   Infection Prevention Recommendations for Individuals Confirmed to have, or Being Evaluated for, 2019 Novel Coronavirus (COVID-19) Infection Who Receive Care at Home  Individuals who are confirmed to have, or are being evaluated for, COVID-19 should follow the prevention steps below until a healthcare provider or local or state health department says they can return to normal activities.  Stay home except to get medical care You should restrict activities outside your home, except for getting medical care. Do not go to work, school, or public areas, and do not use public transportation or taxis.  Call ahead before visiting your doctor Before your medical appointment, call the healthcare provider and tell them that you have, or are being evaluated for, COVID-19 infection. This will help the healthcare provider's office take steps to keep other people from getting infected. Ask your healthcare provider to call the local or state health department.  Monitor your symptoms Seek prompt medical attention if your illness is worsening (e.g., difficulty breathing). Before going to your medical appointment, call the healthcare provider and tell them that you have, or are being evaluated for, COVID-19 infection. Ask your healthcare provider to call the local or state health department.  Wear a facemask You should wear a facemask that covers your nose and mouth when you are in the same room with  other people and when you visit a healthcare provider. People who live with or visit you should also wear a facemask while they are in the same room with you.  Separate yourself from other people in your home As much as possible, you should stay in a different room from other people in your home. Also, you should use a separate bathroom, if available.  Avoid sharing household items You should not share dishes, drinking glasses, cups, eating utensils, towels, bedding, or other items with other people in your home. After using these items, you should wash them thoroughly with soap and water.  Cover your coughs and sneezes Cover your mouth and nose with a tissue when you cough or sneeze, or you can cough or sneeze into your sleeve. Throw used tissues in a lined trash can, and immediately wash your hands with soap and water for at least 20 seconds or use an alcohol-based hand rub.  Wash your [facility] your hands often and thoroughly with soap and water for at least 20 seconds. You can use an alcohol-based hand sanitizer if soap and water are not available and if your hands are not visibly dirty. Avoid touching your eyes, nose, and mouth with unwashed hands.   Prevention Steps for Caregivers and Household Members of Individuals Confirmed to have, or Being Evaluated for, COVID-19 Infection Being Cared for in the Home  If you live with, or provide care at home for, a person confirmed to have, or being evaluated for, COVID-19 infection  please follow these guidelines to prevent infection:  Follow healthcare provider's instructions Make sure that you understand and can help the patient follow any healthcare provider instructions for all care.  Provide for the patient's basic needs You should help the patient with basic needs in the home and provide support for getting groceries, prescriptions, and other personal needs.  Monitor the patient's symptoms If they are getting sicker, call his  or her medical provider and tell them that the patient has, or is being evaluated for, COVID-19 infection. This will help the healthcare provider's office take steps to keep other people from getting infected. Ask the healthcare provider to call the local or state health department.  Limit the number of people who have contact with the patient If possible, have only one caregiver for the patient. Other household members should stay in another home or place of residence. If this is not possible, they should stay in another room, or be separated from the patient as much as possible. Use a separate bathroom, if available. Restrict visitors who do not have an essential need to be in the home.  Keep older adults, very young children, and other sick people away from the patient Keep older adults, very young children, and those who have compromised immune systems or chronic health conditions away from the patient. This includes people with chronic heart, lung, or kidney conditions, diabetes, and cancer.  Ensure good ventilation Make sure that shared spaces in the home have good air flow, such as from an air conditioner or an opened window, weather permitting.  Wash your hands often Wash your hands often and thoroughly with soap and water for at least 20 seconds. You can use an alcohol based hand sanitizer if soap and water are not available and if your hands are not visibly dirty. Avoid touching your eyes, nose, and mouth with unwashed hands. Use disposable paper towels to dry your hands. If not available, use dedicated cloth towels and replace them when they become wet.  Wear a facemask and gloves Wear a disposable facemask at all times in the room and gloves when you touch or have contact with the patient's blood, body fluids, and/or secretions or excretions, such as sweat, saliva, sputum, nasal mucus, vomit, urine, or feces.  Ensure the mask fits over your nose and mouth tightly, and do not touch  it during use. Throw out disposable facemasks and gloves after using them. Do not reuse. Wash your hands immediately after removing your facemask and gloves. If your personal clothing becomes contaminated, carefully remove clothing and launder. Wash your hands after handling contaminated clothing. Place all used disposable facemasks, gloves, and other waste in a lined container before disposing them with other household waste. Remove gloves and wash your hands immediately after handling these items.  Do not share dishes, glasses, or other household items with the patient Avoid sharing household items. You should not share dishes, drinking glasses, cups, eating utensils, towels, bedding, or other items with a patient who is confirmed to have, or being evaluated for, COVID-19 infection. After the person uses these items, you should wash them thoroughly with soap and water.  Wash laundry thoroughly Immediately remove and wash clothes or bedding that have blood, body fluids, and/or secretions or excretions, such as sweat, saliva, sputum, nasal mucus, vomit, urine, or feces, on them. Wear gloves when handling laundry from the patient. Read and follow directions on labels of laundry or clothing items and detergent. In general, wash and dry with  the warmest temperatures recommended on the label.  Clean all areas the individual has used often Clean all touchable surfaces, such as counters, tabletops, doorknobs, bathroom fixtures, toilets, phones, keyboards, tablets, and bedside tables, every day. Also, clean any surfaces that may have blood, body fluids, and/or secretions or excretions on them. Wear gloves when cleaning surfaces the patient has come in contact with. Use a diluted bleach solution (e.g., dilute bleach with 1 part bleach and 10 parts water) or a household disinfectant with a label that says EPA-registered for coronaviruses. To make a bleach solution at home, add 1 tablespoon of bleach to 1  quart (4 cups) of water. For a larger supply, add  cup of bleach to 1 gallon (16 cups) of water. Read labels of cleaning products and follow recommendations provided on product labels. Labels contain instructions for safe and effective use of the cleaning product including precautions you should take when applying the product, such as wearing gloves or eye protection and making sure you have good ventilation during use of the product. Remove gloves and wash hands immediately after cleaning.  Monitor yourself for signs and symptoms of illness Caregivers and household members are considered close contacts, should monitor their health, and will be asked to limit movement outside of the home to the extent possible. Follow the monitoring steps for close contacts listed on the symptom monitoring form.   ? If you have additional questions, contact your local health department or call the epidemiologist on call at [phone] (available 24/7). ? This guidance is subject to change. For the most up-to-date guidance from [facility], please refer to their website: YouBlogs.pl   Increase activity slowly   Complete by:  As directed      Allergies as of 01/19/2019      Reactions   Zoloft [sertraline Hcl]       Medication List    TAKE these medications   calcium carbonate 500 MG chewable tablet Commonly known as:  TUMS - dosed in mg elemental calcium Chew 1 tablet by mouth every 6 (six) hours as needed for indigestion or heartburn.   traZODone 50 MG tablet Commonly known as:  DESYREL Take 50 mg by mouth at bedtime.      Follow-up Information    Deland Pretty, MD. Schedule an appointment as soon as possible for a visit in 1 week(s).   Specialty:  Internal Medicine Contact information: [address] [place] [address] [phone]          Allergies  Allergen Reactions   Zoloft [Sertraline Hcl]      Consultations:   None   Other Procedures/Studies: Dg Chest 2 View  Result Date: 01/12/2019 CLINICAL DATA:  Altered mental status from nursing home. EXAM: CHEST - 2 VIEW COMPARISON:  October 13, 2018 FINDINGS: The heart size and mediastinal contours are within normal limits. Both lungs are clear. The visualized skeletal structures are unremarkable. IMPRESSION: No active cardiopulmonary disease. Electronically Signed   By: Abelardo Diesel M.D.   On: 01/12/2019 11:20   Ct Head Wo Contrast  Result Date: 01/12/2019 CLINICAL DATA:  Mental status changes. Altered level of consciousness. EXAM: CT HEAD WITHOUT CONTRAST TECHNIQUE: Contiguous axial images were obtained from the base of the skull through the vertex without intravenous contrast. COMPARISON:  None. FINDINGS: Brain: There is no evidence for acute hemorrhage, hydrocephalus, mass lesion, or abnormal extra-axial fluid collection. No definite CT evidence for acute infarction. Diffuse loss of parenchymal volume is consistent with atrophy. Patchy low  attenuation in the deep hemispheric and periventricular white matter is nonspecific, but likely reflects chronic microvascular ischemic demyelination. Vascular: No hyperdense vessel or unexpected calcification. Skull: No evidence for fracture. No worrisome lytic or sclerotic lesion. Sinuses/Orbits: The visualized paranasal sinuses and mastoid air cells are clear. Visualized portions of the globes and intraorbital fat are unremarkable. Other: None. IMPRESSION: 1. No acute intracranial abnormality. 2. 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 Electronically Signed   By: Misty Stanley M.D.   On: 01/12/2019 11:00     TODAY-DAY OF DISCHARGE:  Subjective:   [name] today remains pleasantly confused  Objective:   Blood pressure 108/69, pulse 72, temperature 98.4 F (36.9 C), temperature source Oral, resp. rate 18, height 6' (1.829 m), weight 49.8 kg, SpO2 99  %.  Intake/Output Summary (Last 24 hours) at 01/19/2019 0928 Last data filed at 01/19/2019 0859 Gross per 24 hour  Intake 1200 ml  Output --  Net 1200 ml   Filed Weights   01/14/19 1706  Weight: 49.8 kg    Exam: Pleasantly confused, No new F.N deficits, Normal affect [place].AT,PERRAL Supple Neck,No JVD, No cervical lymphadenopathy appriciated.  Symmetrical Chest wall movement, Good air movement bilaterally, CTAB RRR,No Gallops,Rubs or new Murmurs, No Parasternal Heave +ve B.Sounds, Abd Soft, Non tender, No organomegaly appriciated, No rebound -guarding or rigidity. No Cyanosis, Clubbing or edema, No new Rash or bruise   PERTINENT RADIOLOGIC STUDIES: Dg Chest 2 View  Result Date: 01/12/2019 CLINICAL DATA:  Altered mental status from nursing home. EXAM: CHEST - 2 VIEW COMPARISON:  October 13, 2018 FINDINGS: The heart size and mediastinal contours are within normal limits. Both lungs are clear. The visualized skeletal structures are unremarkable. IMPRESSION: No active cardiopulmonary disease. Electronically Signed   By: Abelardo Diesel M.D.   On: 01/12/2019 11:20   Ct Head Wo Contrast  Result Date: 01/12/2019 CLINICAL DATA:  Mental status changes. Altered level of consciousness. EXAM: CT HEAD WITHOUT CONTRAST TECHNIQUE: Contiguous axial images were obtained from the base of the skull through the vertex without intravenous contrast. COMPARISON:  None. FINDINGS: Brain: There is no evidence for acute hemorrhage, hydrocephalus, mass lesion, or abnormal extra-axial fluid collection. No definite CT evidence for acute infarction. Diffuse loss of parenchymal volume is consistent with atrophy. Patchy low attenuation in the deep hemispheric and periventricular white matter is nonspecific, but likely reflects chronic microvascular ischemic demyelination. Vascular: No hyperdense vessel or unexpected calcification. Skull: No evidence for fracture. No worrisome lytic or sclerotic lesion. Sinuses/Orbits: The  visualized paranasal sinuses and mastoid air cells are clear. Visualized portions of the globes and intraorbital fat are unremarkable. Other: None. IMPRESSION: 1. No acute intracranial abnormality. 2. 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 Electronically Signed   By: Misty Stanley M.D.   On: 01/12/2019 11:00     PERTINENT LAB RESULTS: CBC: No results for input(s): WBC, HGB, HCT, PLT in the last 72 hours. CMET CMP     Component Value Date/Time   NA 139 01/12/2019 0958   K 4.3 01/12/2019 0958   CL 104 01/12/2019 0958   CO2 28 01/12/2019 0958   GLUCOSE 104 (H) 01/12/2019 0958   BUN 18 01/12/2019 0958   CREATININE 1.17 01/12/2019 1915   CALCIUM 8.8 (L) 01/12/2019 0958   PROT 7.3 01/12/2019 0958   ALBUMIN 4.1 01/12/2019 0958   AST 28 01/12/2019 0958   ALT 16 01/12/2019 0958   ALKPHOS 67 01/12/2019 0958   BILITOT 1.0 01/12/2019 4401  GFRNONAA >60 01/12/2019 1915   GFRAA >60 01/12/2019 1915    GFR Estimated Creatinine Clearance: 39 mL/min (by C-G formula based on SCr of 1.17 mg/dL). No results for input(s): LIPASE, AMYLASE in the last 72 hours. No results for input(s): CKTOTAL, CKMB, CKMBINDEX, TROPONINI in the last 72 hours. Invalid input(s): POCBNP No results for input(s): DDIMER in the last 72 hours. No results for input(s): HGBA1C in the last 72 hours. No results for input(s): CHOL, HDL, LDLCALC, TRIG, CHOLHDL, LDLDIRECT in the last 72 hours. No results for input(s): TSH, T4TOTAL, T3FREE, THYROIDAB in the last 72 hours.  Invalid input(s): FREET3 No results for input(s): VITAMINB12, FOLATE, FERRITIN, TIBC, IRON, RETICCTPCT in the last 72 hours. Coags: No results for input(s): INR in the last 72 hours.  Invalid input(s): PT Microbiology: Recent Results (from the past 240 hour(s))  Blood culture (routine x 2)     Status: None   Collection Time: 01/12/19  9:58 AM  Result Value Ref Range Status   Specimen Description   Final    BLOOD RIGHT  ANTECUBITAL Performed at [facility] [address] [place], [place] [address]    Special Requests   Final    BOTTLES DRAWN AEROBIC AND ANAEROBIC Blood Culture adequate volume Performed at [facility] [address], [place], [place] [address]    Culture   Final    NO GROWTH 5 DAYS Performed at [facility], [place] [address] [place], [place] [address]    Report Status 01/17/2019 FINAL  Final  Blood culture (routine x 2)     Status: None   Collection Time: 01/12/19 10:03 AM  Result Value Ref Range Status   Specimen Description   Final    BLOOD LEFT ARM Performed at [facility] [address] [place], [place] [address]    Special Requests   Final    BOTTLES DRAWN AEROBIC AND ANAEROBIC Blood Culture results may not be optimal due to an excessive volume of blood received in culture bottles Performed at [facility] [address] [place], [place] [address]    Culture   Final    NO GROWTH 5 DAYS Performed at [facility], [place] [address] [place],  [address]    Report Status 01/17/2019 FINAL  Final  SARS Coronavirus 2 (CEPHEID - Performed in [place] hospital lab), Hosp Order     Status: Abnormal   Collection Time: 01/12/19 10:05 AM  Result Value Ref Range Status   SARS Coronavirus 2 POSITIVE (A) NEGATIVE Corrected    Comment: RESULT CALLED TO, READ BACK BY AND VERIFIED WITH: BINGHAM,S. RN @1131  ON 5.20.20 BY NMCCOY (NOTE) If result is NEGATIVE SARS-CoV-2 target nucleic acids are NOT DETECTED. The SARS-CoV-2 RNA is generally detectable in upper and lower  respiratory specimens during the acute phase of infection. The lowest  concentration of SARS-CoV-2 viral copies this assay can detect is 250  copies / mL. A negative result does not preclude SARS-CoV-2 infection  and should not be used as the sole basis for treatment or other  patient management decisions.  A negative result  may occur with  improper specimen collection / handling, submission of specimen other  than nasopharyngeal swab, presence of viral mutation(s) within the  areas targeted by this assay, and inadequate number of viral copies  (<250 copies / mL). A negative result must be combined with clinical  observations, patient history, and epidemiological information. If result  is POSITIVE SARS-CoV-2 target nucleic acids are DETECTE D. The SARS-CoV-2 RNA is generally detectable in upper and lower  respiratory specimens during the acute phase of infection.  Positive  results are indicative of active infection with SARS-CoV-2.  Clinical  correlation with patient history and other diagnostic information is  necessary to determine patient infection status.  Positive results do  not rule out bacterial infection or co-infection with other viruses. If result is PRESUMPTIVE POSTIVE SARS-CoV-2 nucleic acids MAY BE PRESENT.   A presumptive positive result was obtained on the submitted specimen  and confirmed on repeat testing.  While 2019 novel coronavirus  (SARS-CoV-2) nucleic acids may be present in the submitted sample  additional confirmatory testing may be necessary for epidemiological  and / or clinical management purposes  to differentiate between  SARS-CoV-2 and other Sarbecovirus currently known to infect humans.  If clinically indicated additional testing with an alternate test  methodology (LAB745 3) is advised. The SARS-CoV-2 RNA is generally  detectable in upper and lower respiratory specimens during the acute  phase of infection. The expected result is Negative. Fact Sheet for Patients:  StrictlyIdeas.no Fact Sheet for Healthcare Providers: BankingDealers.co.za This test is not yet approved or cleared by the [place] FDA and has been authorized for detection and/or diagnosis of SARS-CoV-2 by FDA under an Emergency Use Authorization (EUA).   This EUA will remain in effect (meaning this test can be used) for the duration of the COVID-19 declaration under Section 564(b)(1) of the Act, 21 U.S.C. section 360bbb-3(b)(1), unless the authorization is terminated or revoked sooner. Performed at [facility], [place] [address] [place], [place] [address] CORRECTED ON 05/20 AT 1135: PREVIOUSLY REPORTED AS POSITIVE RESULT CALLED TO, READ BACK BY AND VERIFIED WITH: BINGHAM,S. RN @1131  ON 5.20.20 BY NMCCOY   Urine culture     Status: Abnormal   Collection Time: 01/12/19 10:07 AM  Result Value Ref Range Status   Specimen Description   Final    URINE, RANDOM Performed at [facility], [place] [address] [place], [place] [address]    Special Requests   Final    NONE Performed at [facility], [place] [address] [place], [place] [address]    Culture MULTIPLE SPECIES PRESENT, SUGGEST RECOLLECTION (A)  Final   Report Status 01/13/2019 FINAL  Final    FURTHER DISCHARGE INSTRUCTIONS:  Get Medicines reviewed and adjusted: Please take all your medications with you for your next visit with your Primary MD  Laboratory/radiological data: Please request your Primary MD to go over all hospital tests and procedure/radiological results at the follow up, please ask your Primary MD to get all Hospital records sent to his/her office.  In some cases, they will be blood work, cultures and biopsy results pending at the time of your discharge. Please request that your primary care M.D. goes through all the records of your hospital data and follows up on these results.  Also Note the following: If you experience worsening of your admission symptoms, develop shortness of breath, life threatening emergency, suicidal or homicidal thoughts you must seek medical attention immediately by calling 911 or calling your MD immediately  if symptoms less severe.  You must read complete instructions/literature along with  all the possible adverse reactions/side effects for all the Medicines you take and that have been prescribed to you. Take any new Medicines after you have completely understood and accpet all the possible adverse reactions/side effects.   Do not drive when  taking Pain medications or sleeping medications (Benzodaizepines)  Do not take more than prescribed Pain, Sleep and Anxiety Medications. It is not advisable to combine anxiety,sleep and pain medications without talking with your primary care practitioner  Special Instructions: If you have smoked or chewed Tobacco  in the last 2 yrs please stop smoking, stop any regular Alcohol  and or any Recreational drug use.  Wear Seat belts while driving.  Please note: You were cared for by a hospitalist during your hospital stay. Once you are discharged, your primary care physician will handle any further medical issues. Please note that NO REFILLS for any discharge medications will be authorized once you are discharged, as it is imperative that you return to your primary care physician (or establish a relationship with a primary care physician if you do not have one) for your post hospital discharge needs so that they can reassess your need for medications and monitor your lab values.  Total Time spent coordinating discharge including counseling, education and face to face time equals 25 minutes.  SignedOren Binet 01/19/2019 9:28 AM

## 2019-01-18 NOTE — Progress Notes (Signed)
PROGRESS NOTE                                                                                                                                                                                                             Patient Demographics:    Ryan Pope, is a 73 y.o. male, DOB - 02-Apr-1946, MMH:[id number]  Outpatient Primary MD for the patient is Deland Pretty, MD    LOS - 6  Chief Complaint  Patient presents with  . Failure To Thrive       Brief Narrative: Patient is a 73 y.o. male with PMHx of with history of advanced dementia presented to the hospital with altered mental status-upon further evaluation he was found to have COVID-19 infection without any evidence of pulmonary symptoms or infiltrates on chest x-ray.   Subjective:    Ryan Pope today remains confused-no major events overnight.  Spoke with RN-no nausea, vomiting or diarrhea.  Patient appears to be at his baseline-potential discharge tomorrow to SNF when sitter can be arranged.   Assessment  & Plan :   COVID-19 infection: Appears asymptomatic-chest x-ray without infiltrates, inflammatory markers are not elevated.  Continue supportive care  Acute metabolic encephalopathy: Currently was more confused than usual baseline on initial presentation to the hospital-has resolved with just supportive care.  Unclear etiology-nonacute CT head.  No evidence of any other infection.  Dementia: Appears to be advanced-at risk of delirium during this hospital stay.  Continue supportive care.  COVID-19 Labs:  No results for input(s): DDIMER, FERRITIN, LDH, CRP in the last 72 hours.  Lab Results  Component Value Date   SARSCOV2NAA POSITIVE (A) 01/12/2019     COVID-19 Medications: None  ABG: No results found for: PHART, PCO2ART, PO2ART, HCO3, TCO2, ACIDBASEDEF, O2SAT  Condition - Stable  Family Communication  : None at bedside  Code Status :  Full Code   Diet : Regular diet  Disposition Plan  :  Remain inpatient-spoke to social work-back to SNF on Wednesday (5/27)  Consults  : None  Procedures  :  None  DVT Prophylaxis  :  SCDs   Lab Results  Component Value Date   PLT 174 01/12/2019    Inpatient Medications  Scheduled Meds: . traZODone  50 mg Oral QHS   Continuous Infusions: PRN Meds:.acetaminophen, calcium carbonate, ondansetron **OR** ondansetron (ZOFRAN) IV,  senna-docusate  Antibiotics  :    Anti-infectives (From admission, onward)   None       Time Spent in minutes  15   Oren Binet M.D on 01/18/2019 at 1:01 PM  To page go to [email or web address] - use universal password  [facility] -  Office  [phone]  See all Orders from today for further details   Admit date - 01/12/2019    6    Objective:   Vitals:   01/17/19 2001 01/17/19 2348 01/18/19 0837 01/18/19 1210  BP: 115/69 125/73 111/61 124/69  Pulse: 64 (!) 53 62 70  Resp: 18 20 18 20   Temp: 98.3 F (36.8 C) 97.7 F (36.5 C) (!) 97.5 F (36.4 C) 98.6 F (37 C)  TempSrc: Oral Oral Oral Oral  SpO2: 98% 98% 99% 99%  Weight:      Height:        Wt Readings from Last 3 Encounters:  01/14/19 49.8 kg  09/25/15 66.7 kg     Intake/Output Summary (Last 24 hours) at 01/18/2019 1301 Last data filed at 01/18/2019 1300 Gross per 24 hour  Intake 840 ml  Output -  Net 840 ml     Physical Exam Gen Exam:awake-pleasantly confused-not in any distress HEENT:atraumatic, normocephalic Chest: B/L clear to auscultation anteriorly CVS:S1S2 regular Abdomen:soft non tender, non distended Extremities:no edema Neurology: Non focal Skin: no rash   Data Review:    CBC Recent Labs  Lab 01/12/19 0958 01/12/19 1915  WBC 2.5* 3.4*  HGB 13.8 14.5  HCT 40.6 41.9  PLT 163 174  MCV 88.5 87.5  MCH 30.1 30.3  MCHC 34.0 34.6  RDW 12.6 12.6  LYMPHSABS 1.1  --   MONOABS 0.3  --   EOSABS 0.1  --   BASOSABS 0.0  --     Chemistries  Recent  Labs  Lab 01/12/19 0958 01/12/19 1915  NA 139  --   K 4.3  --   CL 104  --   CO2 28  --   GLUCOSE 104*  --   BUN 18  --   CREATININE 1.20 1.17  CALCIUM 8.8*  --   MG  --  2.4  AST 28  --   ALT 16  --   ALKPHOS 67  --   BILITOT 1.0  --    ------------------------------------------------------------------------------------------------------------------ No results for input(s): CHOL, HDL, LDLCALC, TRIG, CHOLHDL, LDLDIRECT in the last 72 hours.  No results found for: HGBA1C ------------------------------------------------------------------------------------------------------------------ No results for input(s): TSH, T4TOTAL, T3FREE, THYROIDAB in the last 72 hours.  Invalid input(s): FREET3 ------------------------------------------------------------------------------------------------------------------ No results for input(s): VITAMINB12, FOLATE, FERRITIN, TIBC, IRON, RETICCTPCT in the last 72 hours.  Coagulation profile No results for input(s): INR, PROTIME in the last 168 hours.  No results for input(s): DDIMER in the last 72 hours.  Cardiac Enzymes Recent Labs  Lab 01/12/19 0958  TROPONINI <0.03   ------------------------------------------------------------------------------------------------------------------ No results found for: BNP  Micro Results Recent Results (from the past 240 hour(s))  Blood culture (routine x 2)     Status: None   Collection Time: 01/12/19  9:58 AM  Result Value Ref Range Status   Specimen Description   Final    BLOOD RIGHT ANTECUBITAL Performed at [facility] [address] [place], [place] [address]    Special Requests   Final    BOTTLES DRAWN AEROBIC AND ANAEROBIC Blood Culture adequate volume Performed at [facility] [address] [place], [place] [address]  Culture   Final    NO GROWTH 5 DAYS Performed at [facility], [place] [address], [place], [place] [address]    Report  Status 01/17/2019 FINAL  Final  Blood culture (routine x 2)     Status: None   Collection Time: 01/12/19 10:03 AM  Result Value Ref Range Status   Specimen Description   Final    BLOOD LEFT ARM Performed at [facility] [address], [place], [place] [address]    Special Requests   Final    BOTTLES DRAWN AEROBIC AND ANAEROBIC Blood Culture results may not be optimal due to an excessive volume of blood received in culture bottles Performed at [facility] [address], [place], [place] [address]    Culture   Final    NO GROWTH 5 DAYS Performed at [facility], [place] [address] [place], [place] [address]    Report Status 01/17/2019 FINAL  Final  SARS Coronavirus 2 (CEPHEID - Performed in [facility] hospital lab), Hosp Order     Status: Abnormal   Collection Time: 01/12/19 10:05 AM  Result Value Ref Range Status   SARS Coronavirus 2 POSITIVE (A) NEGATIVE Corrected    Comment: RESULT CALLED TO, READ BACK BY AND VERIFIED WITH: BINGHAM,S. RN @1131  ON 5.20.20 BY NMCCOY (NOTE) If result is NEGATIVE SARS-CoV-2 target nucleic acids are NOT DETECTED. The SARS-CoV-2 RNA is generally detectable in upper and lower  respiratory specimens during the acute phase of infection. The lowest  concentration of SARS-CoV-2 viral copies this assay can detect is 250  copies / mL. A negative result does not preclude SARS-CoV-2 infection  and should not be used as the sole basis for treatment or other  patient management decisions.  A negative result may occur with  improper specimen collection / handling, submission of specimen other  than nasopharyngeal swab, presence of viral mutation(s) within the  areas targeted by this assay, and inadequate number of viral copies  (<250 copies / mL). A negative result must be combined with clinical  observations, patient history, and epidemiological information. If result is POSITIVE SARS-CoV-2 target nucleic acids are  DETECTE D. The SARS-CoV-2 RNA is generally detectable in upper and lower  respiratory specimens during the acute phase of infection.  Positive  results are indicative of active infection with SARS-CoV-2.  Clinical  correlation with patient history and other diagnostic information is  necessary to determine patient infection status.  Positive results do  not rule out bacterial infection or co-infection with other viruses. If result is PRESUMPTIVE POSTIVE SARS-CoV-2 nucleic acids MAY BE PRESENT.   A presumptive positive result was obtained on the submitted specimen  and confirmed on repeat testing.  While 2019 novel coronavirus  (SARS-CoV-2) nucleic acids may be present in the submitted sample  additional confirmatory testing may be necessary for epidemiological  and / or clinical management purposes  to differentiate between  SARS-CoV-2 and other Sarbecovirus currently known to infect humans.  If clinically indicated additional testing with an alternate test  methodology (LAB745 3) is advised. The SARS-CoV-2 RNA is generally  detectable in upper and lower respiratory specimens during the acute  phase of infection. The expected result is Negative. Fact Sheet for Patients:  StrictlyIdeas.no Fact Sheet for Healthcare Providers: BankingDealers.co.za This test is not yet approved or cleared by the [place] FDA and has been authorized for detection and/or diagnosis of SARS-CoV-2 by FDA under an Emergency Use Authorization (EUA).  This EUA will remain in effect (meaning this test can be used) for the duration of the COVID-19 declaration under Section 564(b)(1) of the Act, 21 U.S.C. section 360bbb-3(b)(1), unless the authorization is terminated or revoked sooner. Performed at [facility], [place] [address] [place], [place] [address] CORRECTED ON 05/20 AT 1135: PREVIOUSLY REPORTED AS POSITIVE RESULT CALLED TO, READ BACK BY  AND VERIFIED WITH: BINGHAM,S. RN @1131  ON 5.20.20 BY NMCCOY   Urine culture     Status: Abnormal   Collection Time: 01/12/19 10:07 AM  Result Value Ref Range Status   Specimen Description   Final    URINE, RANDOM Performed at [facility], [place] [address], [place], [place] [address]    Special Requests   Final    NONE Performed at [facility], [place] [address] [place], [place] [address]    Culture MULTIPLE SPECIES PRESENT, SUGGEST RECOLLECTION (A)  Final   Report Status 01/13/2019 FINAL  Final    Radiology Reports Dg Chest 2 View  Result Date: 01/12/2019 CLINICAL DATA:  Altered mental status from nursing home. EXAM: CHEST - 2 VIEW COMPARISON:  October 13, 2018 FINDINGS: The heart size and mediastinal contours are within normal limits. Both lungs are clear. The visualized skeletal structures are unremarkable. IMPRESSION: No active cardiopulmonary disease. Electronically Signed   By: Abelardo Diesel M.D.   On: 01/12/2019 11:20   Ct Head Wo Contrast  Result Date: 01/12/2019 CLINICAL DATA:  Mental status changes. Altered level of consciousness. EXAM: CT HEAD WITHOUT CONTRAST TECHNIQUE: Contiguous axial images were obtained from the base of the skull through the vertex without intravenous contrast. COMPARISON:  None. FINDINGS: Brain: There is no evidence for acute hemorrhage, hydrocephalus, mass lesion, or abnormal extra-axial fluid collection. No definite CT evidence for acute infarction. Diffuse loss of parenchymal volume is consistent with atrophy. Patchy low attenuation in the deep hemispheric and periventricular white matter is nonspecific, but likely reflects chronic microvascular ischemic demyelination. Vascular: No hyperdense vessel or unexpected calcification. Skull: No evidence for fracture. No worrisome lytic or sclerotic lesion. Sinuses/Orbits: The visualized paranasal sinuses and mastoid air cells are clear. Visualized portions of the globes and  intraorbital fat are unremarkable. Other: None. IMPRESSION: 1. No acute intracranial abnormality. 2. 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 Electronically Signed   By: Misty Stanley M.D.   On: 01/12/2019 11:00

## 2019-01-18 NOTE — Social Work (Signed)
CSW received call from MD stating that RN had been contacted that pt has a bed available. CSW called and left message for Butch Penny and Aquesta with [facility].   CSW continuing to follow for support with disposition when medically appropriate.  Westley Hummer, MSW, [place] Work [phone]

## 2019-01-18 NOTE — NC FL2 (Addendum)
  [place] LEVEL OF CARE SCREENING TOOL     IDENTIFICATION  Patient Name: Ryan Pope Birthdate: August 12, 1946 Sex: male Admission Date (Current Location): 01/12/2019  [place] and [place] Number:  Herbalist and Address:  ([facility], [address], [place], [place] [address])      Provider Number: [id number]  Attending Physician Name and Address:  Jonetta Osgood, MD  Relative Name and Phone Number:       Current Level of Care: Hospital Recommended Level of Care: Memory Care Prior Approval Number:    Date Approved/Denied:   PASRR Number:    Discharge Plan: Memory Care    Current Diagnoses: Patient Active Problem List   Diagnosis Date Noted  . COVID-19 virus infection 01/12/2019  . Dementia without behavioral disturbance ([place]) 01/12/2019  . Dehydration 01/12/2019    Orientation RESPIRATION BLADDER Height & Weight     Self  Normal Continent Weight: 109 lb 12.8 oz (49.8 kg) Height:  6' (182.9 cm)  BEHAVIORAL SYMPTOMS/MOOD NEUROLOGICAL BOWEL NUTRITION STATUS      Incontinent Diet(regular)  AMBULATORY STATUS COMMUNICATION OF NEEDS Skin   Limited Assist Verbally Normal                       Personal Care Assistance Level of Assistance  Bathing, Feeding, Dressing Bathing Assistance: Limited assistance Feeding assistance: Independent Dressing Assistance: Limited assistance     Functional Limitations Info  Sight, Hearing, Speech Sight Info: Adequate Hearing Info: Adequate Speech Info: Adequate    SPECIAL CARE FACTORS FREQUENCY                       Contractures Contractures Info: Not present    Additional Factors Info  Code Status, Allergies Code Status Info: Full Allergies Info: Zoloft Sertraline Hcl           Current Medications (01/18/2019):  This is the current hospital active medication list Current Facility-Administered Medications  Medication Dose Route Frequency Provider Last Rate Last  Dose  . acetaminophen (TYLENOL) tablet 650 mg  650 mg Oral Q6H PRN [place] Eric J, DO   650 mg at 01/14/19 1147  . calcium carbonate (TUMS - dosed in mg elemental calcium) chewable tablet 200 mg of elemental calcium  1 tablet Oral Q6H PRN [place] Eric J, DO      . ondansetron [facility]) tablet 4 mg  4 mg Oral Q6H PRN [place] Eric J, DO       Or  . ondansetron [facility]) injection 4 mg  4 mg Intravenous Q6H PRN [place] Eric J, DO      . senna-docusate (Senokot-S) tablet 1 tablet  1 tablet Oral QHS PRN [place] Eric J, DO      . traZODone (DESYREL) tablet 50 mg  50 mg Oral QHS [place] Donnamarie Poag, DO   50 mg at 01/17/19 2309     Discharge Medications: TAKE these medications   calcium carbonate 500 MG chewable tablet Commonly known as:  TUMS - dosed in mg elemental calcium Chew 1 tablet by mouth every 6 (six) hours as needed for indigestion or heartburn.   traZODone 50 MG tablet Commonly known as:  DESYREL Take 50 mg by mouth at bedtime.     Relevant Imaging Results:  Relevant Lab Results:   Additional Information SS#: [id number]  Geralynn Ochs, LCSW

## 2019-01-18 NOTE — Progress Notes (Signed)
Spoke with social work-patient will have her  ready at [facility].  I have done a discharge summary in anticipation of early discharge tomorrow.

## 2019-01-18 NOTE — Progress Notes (Signed)
CardioVascular Research Department and AHF Team  ReDS Research Project   Patient #: [id number]  ReDS Measurement  Right: 27 %  Left: 30 %

## 2019-01-19 NOTE — Progress Notes (Signed)
Report called to Elby Showers, Resident Care Director at [facility]. Transport called. Family unavailable for update at this time.

## 2019-01-19 NOTE — TOC Transition Note (Signed)
Transition of Care [facility]) - CM/SW Discharge Note   Patient Details  Name: Ryan Pope MRN: [id number] Date of Birth: 10-12-1945  Transition of [facility]) CM/SW Contact:  Alberteen Sam, LCSW Phone Number: 01/19/2019, 12:29 PM   Clinical Narrative:     Patient will DC to: [facility] Anticipated DC date: 01/19/2019 Family notified: [place] (daughter) Transport by: Corey Harold  Per MD patient ready for DC to [facility] . RN, patient, patient's family, and facility notified of DC. Discharge Summary sent to facility. RN given number for report [phone] ask for Butch Penny. DC packet on chart. Ambulance transport requested for patient.  CSW signing off.  [place], Kenneth   Final next level of care: Assisted Living Barriers to Discharge: No Barriers Identified   Patient Goals and CMS Choice   CMS Medicare.gov Compare Post Acute Care list provided to:: Patient Represenative (must comment)([place] (daughter)) Choice offered to / list presented to : Adult Children  Discharge Placement PASRR number recieved: 01/18/19            Patient chooses bed at: [facility] Patient to be transferred to facility by: [place] Name of family member notified: Vedia Pereyra (daughter) Patient and family notified of of transfer: 01/19/19  Discharge Plan and Services     Post Acute Care Choice: (ALF)                               Social Determinants of Health (SDOH) Interventions     Readmission Risk Interventions No flowsheet data found.

## 2019-01-19 NOTE — Progress Notes (Addendum)
9:15 am CSW lvm with Aquesta at [facility] to confirm they are prepared to have patient discharge back to them this morning. CSW awaiting call back for confirmation.   10:15 am CSW has lvm x2 with [place] team, requesting confirmation patient can return this morning. No answer or call backs.   12:00 pm CSW lvm with director of [facility], no answer or call backs at this time. CSW has reached out to [place] as well.   [place], [place]

## 2019-01-19 NOTE — Progress Notes (Signed)
CardioVascular Rewsearch Department and AHF Team  ReDS Research Project   Patient #: [id number]  ReDS Measurement  Right: 24%  Left: 27%

## 2019-01-19 NOTE — Progress Notes (Signed)
Patient was seen and examined-he remained stable.  He was sitting out of bed on the chair-and remains pleasantly confused.  He is in no sort of respiratory distress.  He is stable to discharge to his nursing facility today.  Please see discharge summary for details.

## 2019-02-23 DIAGNOSIS — F0390 Unspecified dementia without behavioral disturbance: Secondary | ICD-10-CM | POA: Diagnosis not present

## 2019-02-23 DIAGNOSIS — F419 Anxiety disorder, unspecified: Secondary | ICD-10-CM | POA: Diagnosis not present

## 2019-02-23 DIAGNOSIS — F5102 Adjustment insomnia: Secondary | ICD-10-CM | POA: Diagnosis not present

## 2019-03-07 DIAGNOSIS — M6281 Muscle weakness (generalized): Secondary | ICD-10-CM | POA: Diagnosis not present

## 2019-03-07 DIAGNOSIS — R488 Other symbolic dysfunctions: Secondary | ICD-10-CM | POA: Diagnosis not present

## 2019-03-07 DIAGNOSIS — N3946 Mixed incontinence: Secondary | ICD-10-CM | POA: Diagnosis not present

## 2019-03-07 DIAGNOSIS — R41841 Cognitive communication deficit: Secondary | ICD-10-CM | POA: Diagnosis not present

## 2019-03-07 DIAGNOSIS — R4701 Aphasia: Secondary | ICD-10-CM | POA: Diagnosis not present

## 2019-03-08 DIAGNOSIS — R488 Other symbolic dysfunctions: Secondary | ICD-10-CM | POA: Diagnosis not present

## 2019-03-08 DIAGNOSIS — R4701 Aphasia: Secondary | ICD-10-CM | POA: Diagnosis not present

## 2019-03-08 DIAGNOSIS — N3946 Mixed incontinence: Secondary | ICD-10-CM | POA: Diagnosis not present

## 2019-03-08 DIAGNOSIS — R41841 Cognitive communication deficit: Secondary | ICD-10-CM | POA: Diagnosis not present

## 2019-03-08 DIAGNOSIS — M6281 Muscle weakness (generalized): Secondary | ICD-10-CM | POA: Diagnosis not present

## 2019-03-09 DIAGNOSIS — R4701 Aphasia: Secondary | ICD-10-CM | POA: Diagnosis not present

## 2019-03-09 DIAGNOSIS — R41841 Cognitive communication deficit: Secondary | ICD-10-CM | POA: Diagnosis not present

## 2019-03-09 DIAGNOSIS — M6281 Muscle weakness (generalized): Secondary | ICD-10-CM | POA: Diagnosis not present

## 2019-03-09 DIAGNOSIS — N3946 Mixed incontinence: Secondary | ICD-10-CM | POA: Diagnosis not present

## 2019-03-09 DIAGNOSIS — R488 Other symbolic dysfunctions: Secondary | ICD-10-CM | POA: Diagnosis not present

## 2019-03-10 DIAGNOSIS — R4701 Aphasia: Secondary | ICD-10-CM | POA: Diagnosis not present

## 2019-03-10 DIAGNOSIS — R488 Other symbolic dysfunctions: Secondary | ICD-10-CM | POA: Diagnosis not present

## 2019-03-10 DIAGNOSIS — N3946 Mixed incontinence: Secondary | ICD-10-CM | POA: Diagnosis not present

## 2019-03-10 DIAGNOSIS — M6281 Muscle weakness (generalized): Secondary | ICD-10-CM | POA: Diagnosis not present

## 2019-03-10 DIAGNOSIS — R41841 Cognitive communication deficit: Secondary | ICD-10-CM | POA: Diagnosis not present

## 2019-03-11 DIAGNOSIS — N3946 Mixed incontinence: Secondary | ICD-10-CM | POA: Diagnosis not present

## 2019-03-11 DIAGNOSIS — R41841 Cognitive communication deficit: Secondary | ICD-10-CM | POA: Diagnosis not present

## 2019-03-11 DIAGNOSIS — R4701 Aphasia: Secondary | ICD-10-CM | POA: Diagnosis not present

## 2019-03-11 DIAGNOSIS — M6281 Muscle weakness (generalized): Secondary | ICD-10-CM | POA: Diagnosis not present

## 2019-03-11 DIAGNOSIS — R488 Other symbolic dysfunctions: Secondary | ICD-10-CM | POA: Diagnosis not present

## 2019-03-14 DIAGNOSIS — R488 Other symbolic dysfunctions: Secondary | ICD-10-CM | POA: Diagnosis not present

## 2019-03-14 DIAGNOSIS — R4701 Aphasia: Secondary | ICD-10-CM | POA: Diagnosis not present

## 2019-03-14 DIAGNOSIS — R41841 Cognitive communication deficit: Secondary | ICD-10-CM | POA: Diagnosis not present

## 2019-03-14 DIAGNOSIS — N3946 Mixed incontinence: Secondary | ICD-10-CM | POA: Diagnosis not present

## 2019-03-14 DIAGNOSIS — M6281 Muscle weakness (generalized): Secondary | ICD-10-CM | POA: Diagnosis not present

## 2019-03-16 DIAGNOSIS — R41841 Cognitive communication deficit: Secondary | ICD-10-CM | POA: Diagnosis not present

## 2019-03-16 DIAGNOSIS — N3946 Mixed incontinence: Secondary | ICD-10-CM | POA: Diagnosis not present

## 2019-03-16 DIAGNOSIS — R488 Other symbolic dysfunctions: Secondary | ICD-10-CM | POA: Diagnosis not present

## 2019-03-16 DIAGNOSIS — M6281 Muscle weakness (generalized): Secondary | ICD-10-CM | POA: Diagnosis not present

## 2019-03-16 DIAGNOSIS — R4701 Aphasia: Secondary | ICD-10-CM | POA: Diagnosis not present

## 2019-03-17 DIAGNOSIS — R488 Other symbolic dysfunctions: Secondary | ICD-10-CM | POA: Diagnosis not present

## 2019-03-17 DIAGNOSIS — M6281 Muscle weakness (generalized): Secondary | ICD-10-CM | POA: Diagnosis not present

## 2019-03-17 DIAGNOSIS — N3946 Mixed incontinence: Secondary | ICD-10-CM | POA: Diagnosis not present

## 2019-03-17 DIAGNOSIS — R4701 Aphasia: Secondary | ICD-10-CM | POA: Diagnosis not present

## 2019-03-17 DIAGNOSIS — R41841 Cognitive communication deficit: Secondary | ICD-10-CM | POA: Diagnosis not present

## 2019-03-18 DIAGNOSIS — R41841 Cognitive communication deficit: Secondary | ICD-10-CM | POA: Diagnosis not present

## 2019-03-18 DIAGNOSIS — M6281 Muscle weakness (generalized): Secondary | ICD-10-CM | POA: Diagnosis not present

## 2019-03-18 DIAGNOSIS — R488 Other symbolic dysfunctions: Secondary | ICD-10-CM | POA: Diagnosis not present

## 2019-03-18 DIAGNOSIS — R4701 Aphasia: Secondary | ICD-10-CM | POA: Diagnosis not present

## 2019-03-18 DIAGNOSIS — N3946 Mixed incontinence: Secondary | ICD-10-CM | POA: Diagnosis not present

## 2019-03-20 DIAGNOSIS — R488 Other symbolic dysfunctions: Secondary | ICD-10-CM | POA: Diagnosis not present

## 2019-03-20 DIAGNOSIS — N3946 Mixed incontinence: Secondary | ICD-10-CM | POA: Diagnosis not present

## 2019-03-20 DIAGNOSIS — R4701 Aphasia: Secondary | ICD-10-CM | POA: Diagnosis not present

## 2019-03-20 DIAGNOSIS — M6281 Muscle weakness (generalized): Secondary | ICD-10-CM | POA: Diagnosis not present

## 2019-03-20 DIAGNOSIS — R41841 Cognitive communication deficit: Secondary | ICD-10-CM | POA: Diagnosis not present

## 2019-03-21 DIAGNOSIS — R41841 Cognitive communication deficit: Secondary | ICD-10-CM | POA: Diagnosis not present

## 2019-03-21 DIAGNOSIS — R4701 Aphasia: Secondary | ICD-10-CM | POA: Diagnosis not present

## 2019-03-21 DIAGNOSIS — M6281 Muscle weakness (generalized): Secondary | ICD-10-CM | POA: Diagnosis not present

## 2019-03-21 DIAGNOSIS — N3946 Mixed incontinence: Secondary | ICD-10-CM | POA: Diagnosis not present

## 2019-03-21 DIAGNOSIS — R488 Other symbolic dysfunctions: Secondary | ICD-10-CM | POA: Diagnosis not present

## 2019-03-22 DIAGNOSIS — N3946 Mixed incontinence: Secondary | ICD-10-CM | POA: Diagnosis not present

## 2019-03-22 DIAGNOSIS — R4701 Aphasia: Secondary | ICD-10-CM | POA: Diagnosis not present

## 2019-03-22 DIAGNOSIS — R41841 Cognitive communication deficit: Secondary | ICD-10-CM | POA: Diagnosis not present

## 2019-03-22 DIAGNOSIS — M6281 Muscle weakness (generalized): Secondary | ICD-10-CM | POA: Diagnosis not present

## 2019-03-22 DIAGNOSIS — R488 Other symbolic dysfunctions: Secondary | ICD-10-CM | POA: Diagnosis not present

## 2019-03-23 DIAGNOSIS — R4701 Aphasia: Secondary | ICD-10-CM | POA: Diagnosis not present

## 2019-03-23 DIAGNOSIS — R41841 Cognitive communication deficit: Secondary | ICD-10-CM | POA: Diagnosis not present

## 2019-03-23 DIAGNOSIS — N3946 Mixed incontinence: Secondary | ICD-10-CM | POA: Diagnosis not present

## 2019-03-23 DIAGNOSIS — M6281 Muscle weakness (generalized): Secondary | ICD-10-CM | POA: Diagnosis not present

## 2019-03-23 DIAGNOSIS — R488 Other symbolic dysfunctions: Secondary | ICD-10-CM | POA: Diagnosis not present

## 2019-03-28 DIAGNOSIS — R488 Other symbolic dysfunctions: Secondary | ICD-10-CM | POA: Diagnosis not present

## 2019-03-28 DIAGNOSIS — N3946 Mixed incontinence: Secondary | ICD-10-CM | POA: Diagnosis not present

## 2019-03-28 DIAGNOSIS — M6281 Muscle weakness (generalized): Secondary | ICD-10-CM | POA: Diagnosis not present

## 2019-03-28 DIAGNOSIS — R41841 Cognitive communication deficit: Secondary | ICD-10-CM | POA: Diagnosis not present

## 2019-03-28 DIAGNOSIS — R4701 Aphasia: Secondary | ICD-10-CM | POA: Diagnosis not present

## 2019-03-29 DIAGNOSIS — M6281 Muscle weakness (generalized): Secondary | ICD-10-CM | POA: Diagnosis not present

## 2019-03-29 DIAGNOSIS — R488 Other symbolic dysfunctions: Secondary | ICD-10-CM | POA: Diagnosis not present

## 2019-03-29 DIAGNOSIS — N3946 Mixed incontinence: Secondary | ICD-10-CM | POA: Diagnosis not present

## 2019-03-29 DIAGNOSIS — R4701 Aphasia: Secondary | ICD-10-CM | POA: Diagnosis not present

## 2019-03-29 DIAGNOSIS — R41841 Cognitive communication deficit: Secondary | ICD-10-CM | POA: Diagnosis not present

## 2019-03-30 DIAGNOSIS — F0391 Unspecified dementia with behavioral disturbance: Secondary | ICD-10-CM | POA: Diagnosis not present

## 2019-03-30 DIAGNOSIS — F5102 Adjustment insomnia: Secondary | ICD-10-CM | POA: Diagnosis not present

## 2019-03-30 DIAGNOSIS — F419 Anxiety disorder, unspecified: Secondary | ICD-10-CM | POA: Diagnosis not present

## 2019-03-30 DIAGNOSIS — N3946 Mixed incontinence: Secondary | ICD-10-CM | POA: Diagnosis not present

## 2019-03-30 DIAGNOSIS — R488 Other symbolic dysfunctions: Secondary | ICD-10-CM | POA: Diagnosis not present

## 2019-03-30 DIAGNOSIS — M6281 Muscle weakness (generalized): Secondary | ICD-10-CM | POA: Diagnosis not present

## 2019-03-30 DIAGNOSIS — R41841 Cognitive communication deficit: Secondary | ICD-10-CM | POA: Diagnosis not present

## 2019-03-30 DIAGNOSIS — R4701 Aphasia: Secondary | ICD-10-CM | POA: Diagnosis not present

## 2019-03-31 DIAGNOSIS — R41841 Cognitive communication deficit: Secondary | ICD-10-CM | POA: Diagnosis not present

## 2019-03-31 DIAGNOSIS — R4701 Aphasia: Secondary | ICD-10-CM | POA: Diagnosis not present

## 2019-03-31 DIAGNOSIS — N3946 Mixed incontinence: Secondary | ICD-10-CM | POA: Diagnosis not present

## 2019-03-31 DIAGNOSIS — M6281 Muscle weakness (generalized): Secondary | ICD-10-CM | POA: Diagnosis not present

## 2019-03-31 DIAGNOSIS — R488 Other symbolic dysfunctions: Secondary | ICD-10-CM | POA: Diagnosis not present

## 2019-04-04 DIAGNOSIS — M6281 Muscle weakness (generalized): Secondary | ICD-10-CM | POA: Diagnosis not present

## 2019-04-04 DIAGNOSIS — N3946 Mixed incontinence: Secondary | ICD-10-CM | POA: Diagnosis not present

## 2019-04-04 DIAGNOSIS — R4701 Aphasia: Secondary | ICD-10-CM | POA: Diagnosis not present

## 2019-04-04 DIAGNOSIS — R41841 Cognitive communication deficit: Secondary | ICD-10-CM | POA: Diagnosis not present

## 2019-04-04 DIAGNOSIS — R488 Other symbolic dysfunctions: Secondary | ICD-10-CM | POA: Diagnosis not present

## 2019-04-05 DIAGNOSIS — M6281 Muscle weakness (generalized): Secondary | ICD-10-CM | POA: Diagnosis not present

## 2019-04-05 DIAGNOSIS — R4701 Aphasia: Secondary | ICD-10-CM | POA: Diagnosis not present

## 2019-04-05 DIAGNOSIS — N3946 Mixed incontinence: Secondary | ICD-10-CM | POA: Diagnosis not present

## 2019-04-05 DIAGNOSIS — R41841 Cognitive communication deficit: Secondary | ICD-10-CM | POA: Diagnosis not present

## 2019-04-05 DIAGNOSIS — R488 Other symbolic dysfunctions: Secondary | ICD-10-CM | POA: Diagnosis not present

## 2019-04-07 DIAGNOSIS — R4701 Aphasia: Secondary | ICD-10-CM | POA: Diagnosis not present

## 2019-04-07 DIAGNOSIS — R488 Other symbolic dysfunctions: Secondary | ICD-10-CM | POA: Diagnosis not present

## 2019-04-07 DIAGNOSIS — M6281 Muscle weakness (generalized): Secondary | ICD-10-CM | POA: Diagnosis not present

## 2019-04-07 DIAGNOSIS — R41841 Cognitive communication deficit: Secondary | ICD-10-CM | POA: Diagnosis not present

## 2019-04-07 DIAGNOSIS — N3946 Mixed incontinence: Secondary | ICD-10-CM | POA: Diagnosis not present

## 2019-04-12 DIAGNOSIS — N3946 Mixed incontinence: Secondary | ICD-10-CM | POA: Diagnosis not present

## 2019-04-12 DIAGNOSIS — R4701 Aphasia: Secondary | ICD-10-CM | POA: Diagnosis not present

## 2019-04-12 DIAGNOSIS — R41841 Cognitive communication deficit: Secondary | ICD-10-CM | POA: Diagnosis not present

## 2019-04-12 DIAGNOSIS — R488 Other symbolic dysfunctions: Secondary | ICD-10-CM | POA: Diagnosis not present

## 2019-04-12 DIAGNOSIS — M6281 Muscle weakness (generalized): Secondary | ICD-10-CM | POA: Diagnosis not present

## 2019-04-13 DIAGNOSIS — R488 Other symbolic dysfunctions: Secondary | ICD-10-CM | POA: Diagnosis not present

## 2019-04-13 DIAGNOSIS — R4701 Aphasia: Secondary | ICD-10-CM | POA: Diagnosis not present

## 2019-04-13 DIAGNOSIS — R41841 Cognitive communication deficit: Secondary | ICD-10-CM | POA: Diagnosis not present

## 2019-04-13 DIAGNOSIS — M6281 Muscle weakness (generalized): Secondary | ICD-10-CM | POA: Diagnosis not present

## 2019-04-13 DIAGNOSIS — N3946 Mixed incontinence: Secondary | ICD-10-CM | POA: Diagnosis not present

## 2019-04-14 DIAGNOSIS — R41841 Cognitive communication deficit: Secondary | ICD-10-CM | POA: Diagnosis not present

## 2019-04-14 DIAGNOSIS — R488 Other symbolic dysfunctions: Secondary | ICD-10-CM | POA: Diagnosis not present

## 2019-04-14 DIAGNOSIS — M6281 Muscle weakness (generalized): Secondary | ICD-10-CM | POA: Diagnosis not present

## 2019-04-14 DIAGNOSIS — N3946 Mixed incontinence: Secondary | ICD-10-CM | POA: Diagnosis not present

## 2019-04-14 DIAGNOSIS — R4701 Aphasia: Secondary | ICD-10-CM | POA: Diagnosis not present

## 2019-04-15 DIAGNOSIS — M6281 Muscle weakness (generalized): Secondary | ICD-10-CM | POA: Diagnosis not present

## 2019-04-15 DIAGNOSIS — R4701 Aphasia: Secondary | ICD-10-CM | POA: Diagnosis not present

## 2019-04-15 DIAGNOSIS — N3946 Mixed incontinence: Secondary | ICD-10-CM | POA: Diagnosis not present

## 2019-04-15 DIAGNOSIS — R41841 Cognitive communication deficit: Secondary | ICD-10-CM | POA: Diagnosis not present

## 2019-04-15 DIAGNOSIS — R488 Other symbolic dysfunctions: Secondary | ICD-10-CM | POA: Diagnosis not present

## 2019-04-18 DIAGNOSIS — R41841 Cognitive communication deficit: Secondary | ICD-10-CM | POA: Diagnosis not present

## 2019-04-18 DIAGNOSIS — R488 Other symbolic dysfunctions: Secondary | ICD-10-CM | POA: Diagnosis not present

## 2019-04-18 DIAGNOSIS — R4701 Aphasia: Secondary | ICD-10-CM | POA: Diagnosis not present

## 2019-04-18 DIAGNOSIS — N3946 Mixed incontinence: Secondary | ICD-10-CM | POA: Diagnosis not present

## 2019-04-18 DIAGNOSIS — M6281 Muscle weakness (generalized): Secondary | ICD-10-CM | POA: Diagnosis not present

## 2019-04-19 DIAGNOSIS — R488 Other symbolic dysfunctions: Secondary | ICD-10-CM | POA: Diagnosis not present

## 2019-04-19 DIAGNOSIS — R41841 Cognitive communication deficit: Secondary | ICD-10-CM | POA: Diagnosis not present

## 2019-04-19 DIAGNOSIS — M6281 Muscle weakness (generalized): Secondary | ICD-10-CM | POA: Diagnosis not present

## 2019-04-19 DIAGNOSIS — N3946 Mixed incontinence: Secondary | ICD-10-CM | POA: Diagnosis not present

## 2019-04-19 DIAGNOSIS — R4701 Aphasia: Secondary | ICD-10-CM | POA: Diagnosis not present

## 2019-04-20 DIAGNOSIS — N3946 Mixed incontinence: Secondary | ICD-10-CM | POA: Diagnosis not present

## 2019-04-20 DIAGNOSIS — M6281 Muscle weakness (generalized): Secondary | ICD-10-CM | POA: Diagnosis not present

## 2019-04-20 DIAGNOSIS — R488 Other symbolic dysfunctions: Secondary | ICD-10-CM | POA: Diagnosis not present

## 2019-04-20 DIAGNOSIS — R41841 Cognitive communication deficit: Secondary | ICD-10-CM | POA: Diagnosis not present

## 2019-04-20 DIAGNOSIS — R4701 Aphasia: Secondary | ICD-10-CM | POA: Diagnosis not present

## 2019-04-26 DIAGNOSIS — R41841 Cognitive communication deficit: Secondary | ICD-10-CM | POA: Diagnosis not present

## 2019-04-26 DIAGNOSIS — R4701 Aphasia: Secondary | ICD-10-CM | POA: Diagnosis not present

## 2019-04-27 DIAGNOSIS — F419 Anxiety disorder, unspecified: Secondary | ICD-10-CM | POA: Diagnosis not present

## 2019-04-27 DIAGNOSIS — F5102 Adjustment insomnia: Secondary | ICD-10-CM | POA: Diagnosis not present

## 2019-04-27 DIAGNOSIS — R4701 Aphasia: Secondary | ICD-10-CM | POA: Diagnosis not present

## 2019-04-27 DIAGNOSIS — F0391 Unspecified dementia with behavioral disturbance: Secondary | ICD-10-CM | POA: Diagnosis not present

## 2019-04-27 DIAGNOSIS — R41841 Cognitive communication deficit: Secondary | ICD-10-CM | POA: Diagnosis not present

## 2019-04-28 DIAGNOSIS — R41841 Cognitive communication deficit: Secondary | ICD-10-CM | POA: Diagnosis not present

## 2019-04-28 DIAGNOSIS — R4701 Aphasia: Secondary | ICD-10-CM | POA: Diagnosis not present

## 2019-06-01 DIAGNOSIS — F419 Anxiety disorder, unspecified: Secondary | ICD-10-CM | POA: Diagnosis not present

## 2019-06-01 DIAGNOSIS — F0391 Unspecified dementia with behavioral disturbance: Secondary | ICD-10-CM | POA: Diagnosis not present

## 2019-06-01 DIAGNOSIS — F5102 Adjustment insomnia: Secondary | ICD-10-CM | POA: Diagnosis not present

## 2019-06-29 DIAGNOSIS — F0391 Unspecified dementia with behavioral disturbance: Secondary | ICD-10-CM | POA: Diagnosis not present

## 2019-06-29 DIAGNOSIS — F419 Anxiety disorder, unspecified: Secondary | ICD-10-CM | POA: Diagnosis not present

## 2019-06-29 DIAGNOSIS — F5102 Adjustment insomnia: Secondary | ICD-10-CM | POA: Diagnosis not present

## 2019-07-19 DIAGNOSIS — Z79899 Other long term (current) drug therapy: Secondary | ICD-10-CM | POA: Diagnosis not present

## 2019-07-27 DIAGNOSIS — F5102 Adjustment insomnia: Secondary | ICD-10-CM | POA: Diagnosis not present

## 2019-07-27 DIAGNOSIS — F0391 Unspecified dementia with behavioral disturbance: Secondary | ICD-10-CM | POA: Diagnosis not present

## 2019-07-27 DIAGNOSIS — F419 Anxiety disorder, unspecified: Secondary | ICD-10-CM | POA: Diagnosis not present

## 2019-08-09 DIAGNOSIS — Z20828 Contact with and (suspected) exposure to other viral communicable diseases: Secondary | ICD-10-CM | POA: Diagnosis not present

## 2019-08-17 DIAGNOSIS — Z1159 Encounter for screening for other viral diseases: Secondary | ICD-10-CM | POA: Diagnosis not present

## 2019-08-23 DIAGNOSIS — Z1159 Encounter for screening for other viral diseases: Secondary | ICD-10-CM | POA: Diagnosis not present

## 2019-08-31 DIAGNOSIS — F419 Anxiety disorder, unspecified: Secondary | ICD-10-CM | POA: Diagnosis not present

## 2019-08-31 DIAGNOSIS — F5102 Adjustment insomnia: Secondary | ICD-10-CM | POA: Diagnosis not present

## 2019-08-31 DIAGNOSIS — F0391 Unspecified dementia with behavioral disturbance: Secondary | ICD-10-CM | POA: Diagnosis not present

## 2019-09-01 DIAGNOSIS — Z20822 Contact with and (suspected) exposure to covid-19: Secondary | ICD-10-CM | POA: Diagnosis not present

## 2019-09-28 DIAGNOSIS — F419 Anxiety disorder, unspecified: Secondary | ICD-10-CM | POA: Diagnosis not present

## 2019-09-28 DIAGNOSIS — F0391 Unspecified dementia with behavioral disturbance: Secondary | ICD-10-CM | POA: Diagnosis not present

## 2019-09-28 DIAGNOSIS — F5102 Adjustment insomnia: Secondary | ICD-10-CM | POA: Diagnosis not present

## 2019-10-07 ENCOUNTER — Other Ambulatory Visit: Payer: Self-pay

## 2019-10-07 ENCOUNTER — Encounter (HOSPITAL_COMMUNITY): Payer: Self-pay

## 2019-10-07 ENCOUNTER — Emergency Department (HOSPITAL_COMMUNITY)
Admission: EM | Admit: 2019-10-07 | Discharge: 2019-10-07 | Disposition: A | Discharge status: Home / Self Care | Payer: No Typology Code available for payment source | Attending: Emergency Medicine | Admitting: Emergency Medicine

## 2019-10-07 DIAGNOSIS — F69 Unspecified disorder of adult personality and behavior: Secondary | ICD-10-CM | POA: Diagnosis present

## 2019-10-07 DIAGNOSIS — Z8546 Personal history of malignant neoplasm of prostate: Secondary | ICD-10-CM | POA: Insufficient documentation

## 2019-10-07 DIAGNOSIS — F0390 Unspecified dementia without behavioral disturbance: Secondary | ICD-10-CM | POA: Insufficient documentation

## 2019-10-07 DIAGNOSIS — R456 Violent behavior: Secondary | ICD-10-CM | POA: Insufficient documentation

## 2019-10-07 NOTE — Discharge Instructions (Addendum)
Please return for any problem.  Follow-up with your regular care provider as instructed. °

## 2019-10-07 NOTE — ED Provider Notes (Signed)
[facility] DEPT Provider Note   CSN: [id number] Arrival date & time: 10/07/19  1831     History Chief Complaint  Patient presents with  . Aggressive Behavior    Ryan Pope is a 74 y.o. male.  74 year old male with prior medical history as detailed below presents for evaluation following behavioral disturbance while at his facility.  Patient is currently calm and cooperative.  He is without specific complaint.  Patient's daughter is contacted.  She reports that the patient has a history of dementia and will have sundowning.  The patient may have become physically aggressive at his facility this evening.  He was sent to the ED for a "timeout."  Prior to transport to the ED he was in a hospital administered medications to help calm him down.      The history is provided by the patient, medical records and a relative.  Illness Location:  Dementia, aggressive outburst Severity:  Mild Onset quality:  Sudden Timing:  Sporadic Progression:  Resolved Chronicity:  Recurrent      Past Medical History:  Diagnosis Date  . Asbestos exposure   . BPH (benign prostatic hyperplasia)   . ED (erectile dysfunction)   . History of agent Orange exposure   . History of low back pain   . Hypercholesteremia   . Prostate cancer ([place])   . Recurrent inguinal hernia     Patient Active Problem List   Diagnosis Date Noted  . COVID-19 virus infection 01/12/2019  . Dementia without behavioral disturbance ([place]) 01/12/2019  . Dehydration 01/12/2019    History reviewed. No pertinent surgical history.     Family History  Problem Relation Age of Onset  . Lung cancer Mother   . Prostate cancer Maternal Uncle     Social History   Tobacco Use  . Smoking status: Never Smoker  . Smokeless tobacco: Never Used  Substance Use Topics  . Alcohol use: No    Alcohol/week: 0.0 standard drinks  . Drug use: No    Home Medications Prior to Admission  medications   Medication Sig Start Date End Date Taking? Authorizing Provider  calcium carbonate (TUMS - DOSED IN MG ELEMENTAL CALCIUM) 500 MG chewable tablet Chew 1 tablet by mouth every 6 (six) hours as needed for indigestion or heartburn.    [provider]  traZODone (DESYREL) 50 MG tablet Take 50 mg by mouth at bedtime. 12/20/18   [provider]    Allergies    Zoloft [sertraline hcl]  Review of Systems   Review of Systems  All other systems reviewed and are negative.   Physical Exam Updated Vital Signs BP (!) 125/94 (BP Location: Left Arm)   Pulse (!) 103   Temp 98.9 F (37.2 C) (Oral)   Resp 14   SpO2 100%   Physical Exam Vitals and nursing note reviewed.  Constitutional:      General: He is not in acute distress.    Appearance: Normal appearance. He is well-developed.  HENT:     Head: Normocephalic and atraumatic.  Eyes:     Conjunctiva/sclera: Conjunctivae normal.     Pupils: Pupils are equal, round, and reactive to light.  Cardiovascular:     Rate and Rhythm: Normal rate and regular rhythm.     Heart sounds: Normal heart sounds.  Pulmonary:     Effort: Pulmonary effort is normal. No respiratory distress.     Breath sounds: Normal breath sounds.  Abdominal:     General:  There is no distension.     Palpations: Abdomen is soft.     Tenderness: There is no abdominal tenderness.  Musculoskeletal:        General: No deformity. Normal range of motion.     Cervical back: Normal range of motion and neck supple.  Skin:    General: Skin is warm and dry.  Neurological:     General: No focal deficit present.     Mental Status: He is alert. Mental status is at baseline.     Comments: Pleasantly demented without acute complaint  Calm, cooperative     ED Results / Procedures / Treatments   Labs (all labs ordered are listed, but only abnormal results are displayed) Labs Reviewed - No data to display  EKG None  Radiology No results  found.  Procedures Procedures (including critical care time)  Medications Ordered in ED Medications - No data to display  ED Course  I have reviewed the triage vital signs and the nursing notes.  Pertinent labs & imaging results that were available during my care of the patient were reviewed by me and considered in my medical decision making (see chart for details).    MDM Rules/Calculators/A&P                      MDM  Screen complete  MAJEED TAMASHIRO was evaluated in Emergency Department on 10/07/2019 for the symptoms described in the history of present illness. He was evaluated in the context of the global COVID-19 pandemic, which necessitated consideration that the patient might be at risk for infection with the SARS-CoV-2 virus that causes COVID-19. Institutional protocols and algorithms that pertain to the evaluation of patients at risk for COVID-19 are in a state of rapid change based on information released by regulatory bodies including the CDC and federal and state organizations. These policies and algorithms were followed during the patient's care in the ED.  Patient is presenting to the ED following reported aggressive outburst at his facility.  Patient with longstanding history of dementia.  Patient with longstanding history of sundowning.  Case discussed with the patient's daughter - she agrees that the patient's outburst is not unusual.  No indication of this evaluation of other acute pathology.  Patient is appropriate for discharge.  Importance of close follow-up stressed.  Final Clinical Impression(s) / ED Diagnoses Final diagnoses:  Dementia without behavioral disturbance, unspecified dementia type [facility])    Rx / DC Orders ED Discharge Orders    None       Valarie Merino, MD 10/07/19 1945

## 2019-10-07 NOTE — ED Notes (Signed)
Joellyn Rued, daughter would like someone to call her in regards to her father, [phone].

## 2019-10-07 NOTE — ED Triage Notes (Addendum)
Patient coming from [facility] with complaints of aggressive behavior. Patient was combative with staff and attempted to rip a catheter out of another patient at the facility. Patient has a history of dementia and has gotten increasingly combative over the past few weeks. Nursing facility administered ordered medications about two hours prior to arrival--lorazepam, trazodone, and haldol. Patient is cooperative at this time.

## 2019-10-07 NOTE — ED Notes (Signed)
Report called to [facility].

## 2019-10-07 NOTE — ED Notes (Signed)
[facility] called for transport.  

## 2019-10-26 DIAGNOSIS — F5102 Adjustment insomnia: Secondary | ICD-10-CM | POA: Diagnosis not present

## 2019-10-26 DIAGNOSIS — Z20822 Contact with and (suspected) exposure to covid-19: Secondary | ICD-10-CM | POA: Diagnosis not present

## 2019-10-26 DIAGNOSIS — F419 Anxiety disorder, unspecified: Secondary | ICD-10-CM | POA: Diagnosis not present

## 2019-10-26 DIAGNOSIS — F0391 Unspecified dementia with behavioral disturbance: Secondary | ICD-10-CM | POA: Diagnosis not present

## 2019-11-03 DIAGNOSIS — Z20822 Contact with and (suspected) exposure to covid-19: Secondary | ICD-10-CM | POA: Diagnosis not present

## 2019-11-08 DIAGNOSIS — Z20822 Contact with and (suspected) exposure to covid-19: Secondary | ICD-10-CM | POA: Diagnosis not present

## 2019-11-15 DIAGNOSIS — Z20822 Contact with and (suspected) exposure to covid-19: Secondary | ICD-10-CM | POA: Diagnosis not present

## 2020-03-17 ENCOUNTER — Emergency Department (HOSPITAL_COMMUNITY): Payer: No Typology Code available for payment source

## 2020-03-17 ENCOUNTER — Other Ambulatory Visit: Payer: Self-pay

## 2020-03-17 ENCOUNTER — Observation Stay (HOSPITAL_COMMUNITY)
Admission: EM | Admit: 2020-03-17 | Discharge: 2020-03-18 | Disposition: A | Discharge status: Home / Self Care | Payer: No Typology Code available for payment source | Attending: Family Medicine | Admitting: Family Medicine

## 2020-03-17 ENCOUNTER — Encounter (HOSPITAL_COMMUNITY): Payer: Self-pay | Admitting: Emergency Medicine

## 2020-03-17 DIAGNOSIS — N179 Acute kidney failure, unspecified: Secondary | ICD-10-CM | POA: Insufficient documentation

## 2020-03-17 DIAGNOSIS — R55 Syncope and collapse: Principal | ICD-10-CM | POA: Diagnosis present

## 2020-03-17 DIAGNOSIS — R410 Disorientation, unspecified: Secondary | ICD-10-CM | POA: Insufficient documentation

## 2020-03-17 DIAGNOSIS — F0390 Unspecified dementia without behavioral disturbance: Secondary | ICD-10-CM | POA: Diagnosis not present

## 2020-03-17 DIAGNOSIS — Z8546 Personal history of malignant neoplasm of prostate: Secondary | ICD-10-CM | POA: Insufficient documentation

## 2020-03-17 DIAGNOSIS — R4020 Unspecified coma: Secondary | ICD-10-CM | POA: Diagnosis not present

## 2020-03-17 DIAGNOSIS — E785 Hyperlipidemia, unspecified: Secondary | ICD-10-CM | POA: Diagnosis not present

## 2020-03-17 DIAGNOSIS — R52 Pain, unspecified: Secondary | ICD-10-CM | POA: Diagnosis not present

## 2020-03-17 DIAGNOSIS — Z20822 Contact with and (suspected) exposure to covid-19: Secondary | ICD-10-CM | POA: Insufficient documentation

## 2020-03-17 DIAGNOSIS — R404 Transient alteration of awareness: Secondary | ICD-10-CM | POA: Diagnosis not present

## 2020-03-17 DIAGNOSIS — R1084 Generalized abdominal pain: Secondary | ICD-10-CM | POA: Diagnosis not present

## 2020-03-17 DIAGNOSIS — Z79899 Other long term (current) drug therapy: Secondary | ICD-10-CM | POA: Insufficient documentation

## 2020-03-17 LAB — COMPREHENSIVE METABOLIC PANEL
ALT: 17 U/L (ref 0–44)
AST: 24 U/L (ref 15–41)
Albumin: 3.6 g/dL (ref 3.5–5.0)
Alkaline Phosphatase: 49 U/L (ref 38–126)
Anion gap: 7 (ref 5–15)
BUN: 22 mg/dL (ref 8–23)
CO2: 27 mmol/L (ref 22–32)
Calcium: 8.8 mg/dL — ABNORMAL LOW (ref 8.9–10.3)
Chloride: 107 mmol/L (ref 98–111)
Creatinine, Ser: 1.49 mg/dL — ABNORMAL HIGH (ref 0.61–1.24)
GFR calc Af Amer: 52 mL/min — ABNORMAL LOW (ref >60)
GFR calc non Af Amer: 45 mL/min — ABNORMAL LOW (ref >60)
Glucose, Bld: 106 mg/dL — ABNORMAL HIGH (ref 70–99)
Potassium: 3.8 mmol/L (ref 3.5–5.1)
Sodium: 141 mmol/L (ref 135–145)
Total Bilirubin: 0.9 mg/dL (ref 0.3–1.2)
Total Protein: 6.6 g/dL (ref 6.5–8.1)

## 2020-03-17 LAB — TROPONIN I (HIGH SENSITIVITY)
Troponin I (High Sensitivity): 12 ng/L (ref <18)
Troponin I (High Sensitivity): 5 ng/L (ref <18)

## 2020-03-17 LAB — PROTIME-INR
INR: 1.1 (ref 0.8–1.2)
Prothrombin Time: 13.3 seconds (ref 11.4–15.2)

## 2020-03-17 LAB — CBC WITH DIFFERENTIAL/PLATELET
Abs Immature Granulocytes: 0.01 10*3/uL (ref 0.00–0.07)
Basophils Absolute: 0 10*3/uL (ref 0.0–0.1)
Basophils Relative: 1 %
Eosinophils Absolute: 0.1 10*3/uL (ref 0.0–0.5)
Eosinophils Relative: 2 %
HCT: 41.4 % (ref 39.0–52.0)
Hemoglobin: 13.9 g/dL (ref 13.0–17.0)
Immature Granulocytes: 0 %
Lymphocytes Relative: 40 %
Lymphs Abs: 1.6 10*3/uL (ref 0.7–4.0)
MCH: 29.7 pg (ref 26.0–34.0)
MCHC: 33.6 g/dL (ref 30.0–36.0)
MCV: 88.5 fL (ref 80.0–100.0)
Monocytes Absolute: 0.4 10*3/uL (ref 0.1–1.0)
Monocytes Relative: 10 %
Neutro Abs: 1.9 10*3/uL (ref 1.7–7.7)
Neutrophils Relative %: 47 %
Platelets: 146 10*3/uL — ABNORMAL LOW (ref 150–400)
RBC: 4.68 MIL/uL (ref 4.22–5.81)
RDW: 13.7 % (ref 11.5–15.5)
WBC: 4 10*3/uL (ref 4.0–10.5)
nRBC: 0 % (ref 0.0–0.2)

## 2020-03-17 LAB — URINALYSIS, ROUTINE W REFLEX MICROSCOPIC
Bilirubin Urine: NEGATIVE
Glucose, UA: NEGATIVE mg/dL
Hgb urine dipstick: NEGATIVE
Ketones, ur: NEGATIVE mg/dL
Leukocytes,Ua: NEGATIVE
Nitrite: NEGATIVE
Protein, ur: NEGATIVE mg/dL
Specific Gravity, Urine: 1.011 (ref 1.005–1.030)
pH: 7 (ref 5.0–8.0)

## 2020-03-17 LAB — ETHANOL: Alcohol, Ethyl (B): <10 mg/dL (ref <10)

## 2020-03-17 LAB — LIPASE, BLOOD: Lipase: 50 U/L (ref 11–51)

## 2020-03-17 LAB — SARS CORONAVIRUS 2 BY RT PCR (HOSPITAL ORDER, PERFORMED IN ~~LOC~~ HOSPITAL LAB): SARS Coronavirus 2: NEGATIVE

## 2020-03-17 LAB — AMMONIA: Ammonia: 14 umol/L (ref 9–35)

## 2020-03-17 MED ORDER — ENOXAPARIN SODIUM 40 MG/0.4ML ~~LOC~~ SOLN
40.0000 mg | SUBCUTANEOUS | Status: DC
  Administered 2020-03-17: 40 mg via SUBCUTANEOUS
  Filled 2020-03-17: qty 0.4

## 2020-03-17 MED ORDER — MIRTAZAPINE 15 MG PO TABS
15.0000 mg | ORAL_TABLET | Freq: Every day | ORAL | Status: DC
  Administered 2020-03-17: 15 mg via ORAL
  Filled 2020-03-17 (×2): qty 1

## 2020-03-17 MED ORDER — ACETAMINOPHEN 325 MG PO TABS
650.0000 mg | ORAL_TABLET | Freq: Four times a day (QID) | ORAL | Status: DC | PRN
  Administered 2020-03-17: 650 mg via ORAL
  Filled 2020-03-17 (×2): qty 2

## 2020-03-17 MED ORDER — ACETAMINOPHEN 650 MG RE SUPP: 650.0000 mg | Freq: Four times a day (QID) | RECTAL | Status: DC | PRN

## 2020-03-17 MED ORDER — RAMELTEON 8 MG PO TABS
8.0000 mg | ORAL_TABLET | Freq: Once | ORAL | Status: AC
  Administered 2020-03-17: 8 mg via ORAL
  Filled 2020-03-17: qty 1

## 2020-03-17 MED ORDER — SERTRALINE HCL 50 MG PO TABS
50.0000 mg | ORAL_TABLET | Freq: Every day | ORAL | Status: DC
  Administered 2020-03-17 – 2020-03-18 (×2): 50 mg via ORAL
  Filled 2020-03-17 (×2): qty 1

## 2020-03-17 MED ORDER — SODIUM CHLORIDE 0.9 % IV BOLUS
500.0000 mL | Freq: Once | INTRAVENOUS | Status: AC
  Administered 2020-03-17: 500 mL via INTRAVENOUS

## 2020-03-17 NOTE — ED Triage Notes (Signed)
Per EMS pt from home.  At baseline pt alert and oriented x 1-2.  Normally combative behavior when upset.  This morning, pt more confused than normal and more combative.  Briefly reported abd pain to EMS but then denied pain.  Went to bathroom and EMS had difficulty getting pt to move off toilet.  Pt agitated and cussed fireman that were trying to help.  Pt now has episodes of not responding and then will have episode of being combative.

## 2020-03-17 NOTE — ED Notes (Signed)
Pt somnolent at this time. Responsive, not responding to verbal/ light touch. Responsive to painful stimulus

## 2020-03-17 NOTE — ED Provider Notes (Signed)
[facility] EMERGENCY DEPARTMENT Provider Note   CSN: [id number] Arrival date & time: 03/17/20  1250     History Chief Complaint  Patient presents with  . Altered Mental Status    Ryan Pope is a 74 y.o. male.  74 year old male with prior medical history as detailed below presents for evaluation of reported abdominal discomfort and near syncope.  Patient's history is provided by his daughter at bedside.  Patient with advanced dementia and is unable to provide significant history.  Level 5 caveat secondary to same.  Patient reportedly awoke this morning and went to the bathroom.  While sitting on the toilet and apparently attempting a BM the patient had a near syncopal episode where he slumped forward.  The patient's daughter was at his side during this event.  He did not fall from the toilet.  He did not completely pass out.  EMS reports that he was mildly agitated during transport to the ED.  Patient is now calm and cooperative.  He does not appear to be in any significant discomfort.  Patient's daughter does confirm CODE STATUS -DNR with this provider.  The history is provided by the patient.  Altered Mental Status Presenting symptoms: combativeness   Severity:  Mild Most recent episode:  Today Episode history:  Single Duration:  1 hour Timing:  Rare Progression:  Resolved Chronicity:  New Associated symptoms: abdominal pain   Abdominal Pain      Past Medical History:  Diagnosis Date  . Asbestos exposure   . BPH (benign prostatic hyperplasia)   . ED (erectile dysfunction)   . History of agent Orange exposure   . History of low back pain   . Hypercholesteremia   . Prostate cancer ([place])   . Recurrent inguinal hernia     Patient Active Problem List   Diagnosis Date Noted  . COVID-19 virus infection 01/12/2019  . Dementia without behavioral disturbance ([place]) 01/12/2019  . Dehydration 01/12/2019    History reviewed. No pertinent surgical  history.     Family History  Problem Relation Age of Onset  . Lung cancer Mother   . Prostate cancer Maternal Uncle     Social History   Tobacco Use  . Smoking status: Never Smoker  . Smokeless tobacco: Never Used  Vaping Use  . Vaping Use: Never used  Substance Use Topics  . Alcohol use: No    Alcohol/week: 0.0 standard drinks  . Drug use: No    Home Medications Prior to Admission medications   Medication Sig Start Date End Date Taking? Authorizing Provider  calcium carbonate (TUMS - DOSED IN MG ELEMENTAL CALCIUM) 500 MG chewable tablet Chew 1 tablet by mouth every 6 (six) hours as needed for indigestion or heartburn.    [provider]  traZODone (DESYREL) 50 MG tablet Take 50 mg by mouth at bedtime. 12/20/18   [provider]    Allergies    Zoloft [sertraline hcl]  Review of Systems   Review of Systems  Gastrointestinal: Positive for abdominal pain.  All other systems reviewed and are negative.   Physical Exam Updated Vital Signs BP (!) 136/71 (BP Location: Left Arm)   Pulse 59   Temp 97.7 F (36.5 C) (Oral)   Resp 17   Ht 5\' 11"  (1.803 m)   Wt 90.7 kg   SpO2 98%   BMI 27.89 kg/m   Physical Exam Vitals and nursing note reviewed.  Constitutional:      General: He is  not in acute distress.    Appearance: Normal appearance. He is well-developed.  HENT:     Head: Normocephalic and atraumatic.  Eyes:     Conjunctiva/sclera: Conjunctivae normal.     Pupils: Pupils are equal, round, and reactive to light.  Cardiovascular:     Rate and Rhythm: Normal rate and regular rhythm.     Heart sounds: Normal heart sounds.  Pulmonary:     Effort: Pulmonary effort is normal. No respiratory distress.     Breath sounds: Normal breath sounds.  Abdominal:     General: There is no distension.     Palpations: Abdomen is soft.     Tenderness: There is no abdominal tenderness.  Musculoskeletal:        General: No deformity. Normal range of motion.      Cervical back: Normal range of motion and neck supple.  Skin:    General: Skin is warm and dry.  Neurological:     Mental Status: He is alert and oriented to person, place, and time.     ED Results / Procedures / Treatments   Labs (all labs ordered are listed, but only abnormal results are displayed) Labs Reviewed  COMPREHENSIVE METABOLIC PANEL - Abnormal; Notable for the following components:      Result Value   Glucose, Bld 106 (*)    Creatinine, Ser 1.49 (*)    Calcium 8.8 (*)    GFR calc non Af Amer 45 (*)    GFR calc Af Amer 52 (*)    All other components within normal limits  CBC WITH DIFFERENTIAL/PLATELET - Abnormal; Notable for the following components:   Platelets 146 (*)    All other components within normal limits  URINALYSIS, ROUTINE W REFLEX MICROSCOPIC - Abnormal; Notable for the following components:   Color, Urine STRAW (*)    All other components within normal limits  SARS CORONAVIRUS 2 BY RT PCR (HOSPITAL ORDER, [facility])  ETHANOL  LIPASE, BLOOD  PROTIME-INR  AMMONIA  TROPONIN I (HIGH SENSITIVITY)  TROPONIN I (HIGH SENSITIVITY)    EKG EKG Interpretation  Date/Time:  Saturday March 17 2020 14:00:04 EDT Ventricular Rate:  59 PR Interval:    QRS Duration: 94 QT Interval:  426 QTC Calculation: 422 R Axis:   31 Text Interpretation: Sinus rhythm Confirmed by Dene Gentry [phone]) on 03/17/2020 2:01:29 PM   Radiology CT ABDOMEN PELVIS WO CONTRAST  Result Date: 03/17/2020 CLINICAL DATA:  Confusion and possible abdominal pain. EXAM: CT ABDOMEN AND PELVIS WITHOUT CONTRAST TECHNIQUE: Multidetector CT imaging of the abdomen and pelvis was performed following the standard protocol without IV contrast. COMPARISON:  None. FINDINGS: Lower chest: Lung bases clear. Calcified plaque over the descending thoracic aorta. Hepatobiliary: Multiple liver cysts are present with the largest over the left lobe measuring 4.2 cm. Gallbladder and  biliary tree are within normal. Pancreas: Normal. Spleen: Normal. Adrenals/Urinary Tract: Adrenal glands are normal. Kidneys are normal size without hydronephrosis, focal mass or nephrolithiasis. Ureters and bladder are normal. Stomach/Bowel: Stomach is normal. Prominent diverticula over the third portion of the duodenum. Small bowel is otherwise unremarkable. Minimal diverticulosis of the colon. Appendix is normal. Vascular/Lymphatic: Mild calcified plaque over the abdominal aorta which is normal in caliber. No adenopathy. Reproductive: Mild prominence of the prostate gland measuring 5.9 cm in greatest diameter. Other: Surgical sutures over the lower anterior pelvic wall. Multiple pelvic phleboliths. No free fluid or focal inflammatory change. Musculoskeletal: Mild degenerative change of the spine with  disc disease at the L4-5 level. IMPRESSION: 1.  No acute findings in the abdomen/pelvis. 2. Multiple liver cysts with the largest measuring 4.2 cm over the left lobe. 3.  Mild colonic diverticulosis. 4.  Aortic Atherosclerosis (ICD10-I70.0). 5.  Mild prostatic enlargement. Electronically Signed   By: Marin Olp M.D.   On: 03/17/2020 14:10   CT Head Wo Contrast  Result Date: 03/17/2020 CLINICAL DATA:  Delirium, more confused than normal and more combative, agitation, history prostate cancer EXAM: CT HEAD WITHOUT CONTRAST TECHNIQUE: Contiguous axial images were obtained from the base of the skull through the vertex without intravenous contrast. Sagittal and coronal MPR images reconstructed from axial data set. COMPARISON:  01/12/2019 FINDINGS: Brain: Generalized atrophy. Normal ventricular morphology. No midline shift or mass effect. Minimal small vessel chronic ischemic changes of deep cerebral white matter. No intracranial hemorrhage, mass lesion or evidence of acute infarction. No extra-axial fluid collections. Vascular: Atherosclerotic calcification of internal carotid arteries bilaterally at skull base. No  hyperdense vessels. Skull: Intact Sinuses/Orbits: Clear Other: N/A IMPRESSION: Atrophy with minimal small vessel chronic ischemic changes of deep cerebral white matter. No acute intracranial abnormalities. Electronically Signed   By: Lavonia Dana M.D.   On: 03/17/2020 14:09   DG Chest Port 1 View  Result Date: 03/17/2020 CLINICAL DATA:  Syncope EXAM: PORTABLE CHEST 1 VIEW COMPARISON:  01/12/2019 FINDINGS: Single view supine chest radiograph. Lungs are well expanded, symmetric, and clear. No pneumothorax or pleural effusion. Cardiac size within normal limits. Pulmonary vascularity is normal. Osseous structures are age-appropriate. No acute bone abnormality. IMPRESSION: No active disease. Electronically Signed   By: Fidela Salisbury MD   On: 03/17/2020 15:04    Procedures Procedures (including critical care time)  Medications Ordered in ED Medications - No data to display  ED Course  I have reviewed the triage vital signs and the nursing notes.  Pertinent labs & imaging results that were available during my care of the patient were reviewed by me and considered in my medical decision making (see chart for details).    MDM Rules/Calculators/A&P                          MDM  Screen complete  Ryan Pope was evaluated in Emergency Department on 03/17/2020 for the symptoms described in the history of present illness. He was evaluated in the context of the global COVID-19 pandemic, which necessitated consideration that the patient might be at risk for infection with the SARS-CoV-2 virus that causes COVID-19. Institutional protocols and algorithms that pertain to the evaluation of patients at risk for COVID-19 are in a state of rapid change based on information released by regulatory bodies including the CDC and federal and state organizations. These policies and algorithms were followed during the patient's care in the ED.  Presented for evaluation of reported near syncope with associated vague  abdominal discomfort.  Work-up in the ED did not reveal significant acute pathology.  Patient may benefit from overnight observation.  Medicine team is aware case and will evaluate for admission.  Final Clinical Impression(s) / ED Diagnoses Final diagnoses:  Near syncope    Rx / DC Orders ED Discharge Orders    None       Valarie Merino, MD 03/17/20 [phone]

## 2020-03-17 NOTE — ED Notes (Signed)
Pt's daughter, Vedia Pereyra, would like to be called with any updates. [phone]

## 2020-03-17 NOTE — Progress Notes (Signed)
Received page from RN stating patient agitated and pulling off telemetry lines.  Daughter at bedside and per RN reports patient normally not like this as he takes melatonin at night.  -Okay to leave telemetry off until patient settles -Continue pulse oximetry -Remeron 8 mg once -Continue to monitor agitation  Carollee Leitz, MD PGY-2 Family Medicine Residency

## 2020-03-17 NOTE — H&P (Addendum)
[facility] Admission History and Physical Service Pager: [phone]  Patient name: Ryan Pope Medical record number: [id number] Date of birth: 05-Feb-1946 Age: 74 y.o. Gender: male  Primary Care Provider: [facility] Consultants: None Code Status: DNR Preferred Emergency Contact: Joellyn Rued [phone]  Chief Complaint: Near syncopal episode  Assessment and Plan: Ryan Pope is a 74 y.o. male presenting with near syncope episode. PMH is significant for dementia and hypercholesteremia.   Vasovagal syncope Daughter states that patient had a bowel movement this morning when his legs felt stiff and he slumped over on the toilet. This has never happen before. Daughter and aide could not get patient out of the restroom to the bed. Patient experiencing tremors and leg cramps as well. In the ED, CT head demonstrated no acute intracranial abnormalities. CT abdomen/pelvis showed no acute findings in the abdomen/pelvis and no clear etiology as to his current condition. CXR demonstrated no active disease. EKG w/ NSR. Troponin 12 wnl, ethanol wnl, glucose 106, ammonia 14 wnl. UA wnl. Given patient's history, I have less concern for seizure. More likely vasovagal given the events occurring surrounding a BM and not having eaten anything all day. Also consider increased drowsiness/fluctuant responsiveness due to medication. Daughter endorse an increase of melatonin 6mg  to 9mg . Furthermore, patient's additional polypharmacy (remeron, sertraline) and extensive psychiatry history could also be contributing factors leading to this near syncopal episode. He appears stable on RA and does not have any symptoms of chest pain, shortness of breath. Will admit to FPTS for observation.  -Admit to med telemetry, attending Dr. Erin Hearing -IV fluids  -recheck CBC and BMP am -up with assistance  -Consider echo -Placed in observation, continue to monitor -Consider orthostatic  vital signs    Dementia Patient lives with daughter and ambulates without limitation at baseline. He does not recall any of the events leading up to his admission. Daughter reports that he has been having trouble sleeping which is the reason that his melatonin dose increased recently. Home meds include remeron 15 mg, sertraline 100 mg and melatonin 9 mg from 6 mg. Daughter recently discovered that patient is allergic to sertraline. When questioned, patient does not always answer appropriate to the question. Neurological examination slightly limited based on patient's inability to follow all commands appropriately. -remeron 15mg ; consider increasing with discontinuing melatonin -Start reduced dose of zoloft 50 mg  -Discontinue melatonin -incorporate sleep hygiene  -delirium precautions -PT/OT eval  AKI Cr 1.49 at admission. From labs able to be viewed patient Cr wnl at baseline, unable to know definitively since patient sees [facility] [place] regularly.  -Continue to monitor with CMP or BMP -Avoid nephrotoxic drugs -500cc bolus x1 -Encourage PO intake  Hypercholesteremia  Per chart review, unable to determine details on history.  -Consider lipid panel -[facility]   FEN/GI: Regular diet  Prophylaxis: Lovenox   Disposition: Admit to med telemetry, attending Dr. Erin Hearing  History of Present Illness:  Ryan Pope is a 74 y.o. male presenting with a near syncope episode.  Patient is a poor historian, history was provided by daughter who was present at bedside.  Patient woke up this morning and was able to urinate and defecate with no issues.  But as daughter was trying to get him off the toilet she noticed that his legs were shaking and he was slumped over.  Patient was unable to stand up.  Patient usually able to ambulate without assistance or limitation at baseline, does not require  walker, wheelchair or cane.  Complained of abdominal pain this morning but denies any currently.   Daughter denies any loss of consciousness, falls and associated injuries.  But states that he was confused and still does not remember what happened.  Daughter asked the aide to help get patient from the bathroom to the bed.  EMS was called and patient could not understand what was happening and was agitated with EMS.  Daughter states that patient's only past medical history is dementia and receives regular care at the [facility].  He sees psychiatry regularly for behavioral disturbances.  Melatonin dose recently increased from 6 mg to 9 mg due to patient having trouble sleeping.  Daughter was not aware that the patient is allergic to sertraline because he was living in a facility until April 2021 and believes that he was taking high doses of medications.  Patient is unaware of his reaction to sertraline.  Daughter states that patient complains of having leg cramps and tremors as he was coming out of the CT scan.  Daughter denies anything like this ever happening before.  Denies tobacco and alcohol use. Denies chest pain, dyspnea, nausea, vomiting, dizziness and weakness.  Denies any tongue biting or lip smacking.  Daughter states that patient has not eaten anything all day.  Review Of Systems: Per HPI with the following additions:   Review of Systems  Constitutional: Negative for chills and fever.  HENT: Negative for hearing loss.   Respiratory: Negative for shortness of breath.   Cardiovascular: Negative for chest pain.  Gastrointestinal: Negative for abdominal pain, nausea and vomiting.  Musculoskeletal: Negative for gait problem.  Neurological: Positive for tremors. Negative for dizziness, facial asymmetry and weakness.  Psychiatric/Behavioral: Positive for confusion. Negative for agitation.    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 No pertinent surgical history.  Social History: Social History   Tobacco Use   Smoking status: Never Smoker   Smokeless tobacco: Never Used  Scientific laboratory technician Use: Never used  Substance Use Topics   Alcohol use: No    Alcohol/week: 0.0 standard drinks   Drug use: No   Please also refer to relevant sections of EMR.  Family History: Family History  Problem Relation Age of Onset   Lung cancer Mother    Prostate cancer Maternal Uncle    Allergies and Medications: Allergies  Allergen Reactions   Benzodiazepines Other (See Comments)    Once the effect of the medication wears off, he becomes worse off than he was initially- "combative and beyond agitated"   Other Other (See Comments)    "Anti-psychotics" = Once the effect of the medication wears off, he becomes worse off than he was initially- "combative and beyond agitated"   Zoloft [Sertraline Hcl] Other (See Comments)    Patient is tolerating in 2021 (??)   No current facility-administered medications on file prior to encounter.   Current Outpatient Medications on File Prior to Encounter  Medication Sig Dispense Refill  ascorbic acid (VITAMIN C) 500 MG tablet Take 500-1,000 mg by mouth daily with breakfast.     COCONUT OIL PO Take 2 Scoops by mouth See admin instructions. Mix 2 scoopsful of powder into a smoothie and drink every morning     Iodine, Kelp, (KELP PO) Take 1 capsule by mouth daily with breakfast.     LECITHIN PO Take 1 capsule by mouth daily with breakfast.     melatonin 3 MG TABS tablet Take 9 mg by mouth at bedtime.     mirtazapine (REMERON) 15 MG tablet Take 15 mg by mouth at  bedtime.     Misc Natural Products (SAW PALMETTO) CAPS Take 1 capsule by mouth daily with breakfast.     sertraline (ZOLOFT) 100 MG tablet Take 100 mg by mouth See admin instructions. Take 100 mg by mouth in the late morning (daily)     VITAMIN E PO Take 1 capsule by mouth daily with breakfast.     calcium carbonate (TUMS - DOSED IN MG ELEMENTAL CALCIUM) 500 MG chewable tablet Chew 1 tablet by mouth every 6 (six) hours as needed for indigestion or heartburn. (Patient not taking: Reported on 03/17/2020)     traZODone (DESYREL) 50 MG tablet Take 50 mg by mouth at bedtime. (Patient not taking: Reported on 03/17/2020)      Objective: BP (!) 136/71 (BP Location: Left Arm)    Pulse 59    Temp 97.7 F (36.5 C) (Oral)    Resp 17    Ht 5\' 11"  (1.803 m)    Wt 90.7 kg    SpO2 98%    BMI 27.89 kg/m  Exam: General: Patient no acute distress. Eyes: Pupils equal, round and reactive to light bilaterally Neck: No JVD noted Cardiovascular: Regular rate and rhythm, no murmurs or gallops appreciated Respiratory: Lungs clear to auscultation bilaterally, no wheezing or rhonchi noted Gastrointestinal: Nontender and active bowel sounds MSK: 1+ pitting edema in LE and feet bilaterally, feet cool to touch, no deformities noted Derm: No rashes or lesions noted Neuro: Alert to person only, follows some commands, CN2-12 grossly intact, strength 5/5 UE bilaterally, finger to nose testing normal on the right and limited left with ability to follow commands  Psych: Appropriate mood and affect, no agitation noted  Labs and Imaging: CBC BMET  Recent Labs  Lab 03/17/20 1306  WBC 4.0  HGB 13.9  HCT 41.4  PLT 146*   Recent Labs  Lab 03/17/20 1306  NA 141  K 3.8  CL 107  CO2 27  BUN 22  CREATININE 1.49*  GLUCOSE 106*  CALCIUM 8.8*     EKG: normal sinus rhythm   CT ABDOMEN PELVIS WO CONTRAST  Result Date: 03/17/2020 CLINICAL DATA:  Confusion and possible abdominal pain. EXAM: CT ABDOMEN AND PELVIS  WITHOUT CONTRAST TECHNIQUE: Multidetector CT imaging of the abdomen and pelvis was performed following the standard protocol without IV contrast. COMPARISON:  None. FINDINGS: Lower chest: Lung bases clear. Calcified plaque over the descending thoracic aorta. Hepatobiliary: Multiple liver cysts are present with the largest over the left lobe measuring 4.2 cm. Gallbladder and biliary tree are within normal. Pancreas: Normal. Spleen: Normal. Adrenals/Urinary Tract: Adrenal glands are normal. Kidneys are normal size without hydronephrosis, focal mass or nephrolithiasis. Ureters and bladder are normal. Stomach/Bowel: Stomach is normal. Prominent diverticula over the third portion of the duodenum. Small bowel is otherwise unremarkable. Minimal diverticulosis of the colon. Appendix is normal. Vascular/Lymphatic: Mild calcified plaque over the abdominal aorta which  is normal in caliber. No adenopathy. Reproductive: Mild prominence of the prostate gland measuring 5.9 cm in greatest diameter. Other: Surgical sutures over the lower anterior pelvic wall. Multiple pelvic phleboliths. No free fluid or focal inflammatory change. Musculoskeletal: Mild degenerative change of the spine with disc disease at the L4-5 level. IMPRESSION: 1.  No acute findings in the abdomen/pelvis. 2. Multiple liver cysts with the largest measuring 4.2 cm over the left lobe. 3.  Mild colonic diverticulosis. 4.  Aortic Atherosclerosis (ICD10-I70.0). 5.  Mild prostatic enlargement. Electronically Signed   By: Marin Olp M.D.   On: 03/17/2020 14:10   CT Head Wo Contrast  Result Date: 03/17/2020 CLINICAL DATA:  Delirium, more confused than normal and more combative, agitation, history prostate cancer EXAM: CT HEAD WITHOUT CONTRAST TECHNIQUE: Contiguous axial images were obtained from the base of the skull through the vertex without intravenous contrast. Sagittal and coronal MPR images reconstructed from axial data set. COMPARISON:  01/12/2019  FINDINGS: Brain: Generalized atrophy. Normal ventricular morphology. No midline shift or mass effect. Minimal small vessel chronic ischemic changes of deep cerebral white matter. No intracranial hemorrhage, mass lesion or evidence of acute infarction. No extra-axial fluid collections. Vascular: Atherosclerotic calcification of internal carotid arteries bilaterally at skull base. No hyperdense vessels. Skull: Intact Sinuses/Orbits: Clear Other: N/A IMPRESSION: Atrophy with minimal small vessel chronic ischemic changes of deep cerebral white matter. No acute intracranial abnormalities. Electronically Signed   By: Lavonia Dana M.D.   On: 03/17/2020 14:09   DG Chest Port 1 View  Result Date: 03/17/2020 CLINICAL DATA:  Syncope EXAM: PORTABLE CHEST 1 VIEW COMPARISON:  01/12/2019 FINDINGS: Single view supine chest radiograph. Lungs are well expanded, symmetric, and clear. No pneumothorax or pleural effusion. Cardiac size within normal limits. Pulmonary vascularity is normal. Osseous structures are age-appropriate. No acute bone abnormality. IMPRESSION: No active disease. Electronically Signed   By: Fidela Salisbury MD   On: 03/17/2020 15:04   Donney Dice, DO 03/17/2020, 4:31 PM PGY-1, [facility] pager: [phone], text pages welcome  Gerlene Fee, DO 03/17/2020, 7:00 PM PGY-2, [place]

## 2020-03-18 DIAGNOSIS — R55 Syncope and collapse: Secondary | ICD-10-CM | POA: Diagnosis not present

## 2020-03-18 LAB — BASIC METABOLIC PANEL
Anion gap: 9 (ref 5–15)
BUN: 21 mg/dL (ref 8–23)
CO2: 25 mmol/L (ref 22–32)
Calcium: 8.8 mg/dL — ABNORMAL LOW (ref 8.9–10.3)
Chloride: 106 mmol/L (ref 98–111)
Creatinine, Ser: 1.35 mg/dL — ABNORMAL HIGH (ref 0.61–1.24)
GFR calc Af Amer: 59 mL/min — ABNORMAL LOW (ref >60)
GFR calc non Af Amer: 51 mL/min — ABNORMAL LOW (ref >60)
Glucose, Bld: 109 mg/dL — ABNORMAL HIGH (ref 70–99)
Potassium: 3.9 mmol/L (ref 3.5–5.1)
Sodium: 140 mmol/L (ref 135–145)

## 2020-03-18 LAB — TSH: TSH: 5.416 u[IU]/mL — ABNORMAL HIGH (ref 0.350–4.500)

## 2020-03-18 LAB — CBC
HCT: 42.4 % (ref 39.0–52.0)
Hemoglobin: 14.3 g/dL (ref 13.0–17.0)
MCH: 29.5 pg (ref 26.0–34.0)
MCHC: 33.7 g/dL (ref 30.0–36.0)
MCV: 87.6 fL (ref 80.0–100.0)
Platelets: 152 10*3/uL (ref 150–400)
RBC: 4.84 MIL/uL (ref 4.22–5.81)
RDW: 13.8 % (ref 11.5–15.5)
WBC: 4 10*3/uL (ref 4.0–10.5)
nRBC: 0 % (ref 0.0–0.2)

## 2020-03-18 MED ORDER — SERTRALINE HCL 50 MG PO TABS: 50.0000 mg | ORAL_TABLET | Freq: Every day | ORAL | 0 refills | Status: AC

## 2020-03-18 NOTE — Plan of Care (Signed)

## 2020-03-18 NOTE — Discharge Instructions (Signed)
Dear Ryan Pope,   Thank you so much for allowing [place] to be part of your care!  You were admitted to [facility] for a near passing out episode after using the restroom.  We believe it was likely combination of dehydration and a reflex response ("micturition vasovagal") that caused this episode.  While you are here you had a CT of your head and abdomen which did not show anything new to contribute to symptoms.  Your blood level, electrolytes, liver function were all normal.  Your kidneys were a little bit bumped up likely due to dehydration but improved prior to discharge, you should follow up with this with your PCP.   POST-HOSPITAL & CARE INSTRUCTIONS 1. Encourage him to drink small sips of fluid throughout the day to maintain hydration, you can add flavorings to water if he does not like the taste. 2. Recommend maintaining the previous dose of melatonin to help sleep. 3. Please let PCP/Specialists know of any changes that were made.  4. Please see medications section of this packet for any medication changes.   RETURN PRECAUTIONS: Return if any recurrence of symptoms or abnormal behavior.  Take care and be well!  [facility]  [place], [place] [address] [phone]

## 2020-03-18 NOTE — Evaluation (Signed)
Physical Therapy Evaluation Patient Details Name: Ryan Pope MRN: [id number] DOB: 1946/04/14 Today's Date: 03/18/2020   History of Present Illness  73 yo M admitted after near syncopal episode on the commode, thought to be vasovagal ersposonce. PMH significant for but not limited to dementia, prostate cancer, and exposore to agent orange and asbestos   Clinical Impression  Pt admitted with above diagnosis. Pt likely at baseline level of mobility and per daughter they are discharging today. Pt displayed some mild balance issues but nothing significant at this time. PT will sign-off.      Follow Up Recommendations No PT follow up;Other (comment) (Supervision as per pre-hospitilization)    Equipment Recommendations  None recommended by PT    Recommendations for Other Services       Precautions / Restrictions Precautions Precautions: Fall Restrictions Weight Bearing Restrictions: No      Mobility  Bed Mobility Overal bed mobility:  (Pt up in chair upon PTs arrival)                Transfers Overall transfer level: Modified independent Equipment used: None                Ambulation/Gait Ambulation/Gait assistance: Supervision Gait Distance (Feet): 250 Feet Assistive device: None Gait Pattern/deviations: Drifts right/left (had an occassional drift left or right, able to self correct) Gait velocity: WFL      Stairs Stairs: Yes Stairs assistance: Supervision Stair Management: One rail Right Number of Stairs: 3 (practiced twice) General stair comments: no difficulties with stair managemnt  Wheelchair Mobility    Modified Rankin (Stroke Patients Only)       Balance Overall balance assessment: Mild deficits observed, not formally tested                                           Pertinent Vitals/Pain Pain Assessment: No/denies pain    Home Living Family/patient expects to be discharged to:: Private residence Living  Arrangements: Children Available Help at Discharge: Family;Available 24 hours/day Type of Home: House Home Access: Stairs to enter   [employer] of Steps: several Home Layout: One level Home Equipment: None      Prior Function Level of Independence: Needs assistance (Pt indpendent with mobility but has caregivers d/t dementia )               Hand Dominance        Extremity/Trunk Assessment   Upper Extremity Assessment Upper Extremity Assessment: Defer to OT evaluation    Lower Extremity Assessment Lower Extremity Assessment: Overall WFL for tasks assessed    Cervical / Trunk Assessment Cervical / Trunk Assessment: Normal  Communication   Communication: No difficulties  Cognition Arousal/Alertness: Awake/alert Behavior During Therapy: WFL for tasks assessed/performed Overall Cognitive Status: History of cognitive impairments - at baseline                                        General Comments General comments (skin integrity, edema, etc.): Pt's daughter present. She reports pt with no history of falls and she provided details about home situation    Exercises     Assessment/Plan    PT Assessment Patent does not need any further PT services  PT Problem List         PT  Treatment Interventions      PT Goals (Current goals can be found in the Care Plan section)       Frequency     Barriers to discharge        Co-evaluation               AM-PAC PT "6 Clicks" Mobility  Outcome Measure Help needed turning from your back to your side while in a flat bed without using bedrails?: None Help needed moving from lying on your back to sitting on the side of a flat bed without using bedrails?: None Help needed moving to and from a bed to a chair (including a wheelchair)?: None Help needed standing up from a chair using your arms (e.g., wheelchair or bedside chair)?: None Help needed to walk in hospital room?: A Little Help  needed climbing 3-5 steps with a railing? : A Little 6 Click Score: 22    End of Session Equipment Utilized During Treatment: Gait belt Activity Tolerance: Patient tolerated treatment well Patient left: in chair;with family/visitor present;Other (comment) (Advised daughter to let RN know if she was leaving the room ) Nurse Communication: Mobility status PT Visit Diagnosis: Unsteadiness on feet (R26.81)    Time: 3291-9166 PT Time Calculation (min) (ACUTE ONLY): 17 min   Charges:   PT Evaluation $PT Eval Low Complexity: [place], PT   Acute Rehabilitation Services  Pager [phone] Office [phone] 03/18/2020   [name] 03/18/2020, 12:42 PM

## 2020-03-18 NOTE — Progress Notes (Addendum)
Family Medicine Teaching Service Daily Progress Note Intern Pager: [phone]  Patient name: Ryan Pope Medical record number: [id number] Date of birth: 1946-05-29 Age: 74 y.o. Gender: male  Primary Care Provider: [place] [facility] Consultants: None Code Status: DNR  Assessment and Plan: Ryan Pope is a 74 y.o. male presenting with near syncope episode. PMH is significant for dementia and hypercholesteremia.   Suspected vasovagal near-syncope: Stable.  No further episodes since admit.  Orthostatics WNL.  TSH mildly elevated at 5.4, low likelihood of contribution.  Likely vasovagal with micturition/defecation and dehydration.  -Monitor BP -Telemetry -Encourage oral hydration, stress importance with daughter -Obtain T4, follow-up with PCP -Consider echocardiogram in the future, no concern for heart failure or valvular disease at this time  Dementia: Chronic, stable. Lives with daughter, requires 24-hour care. [facility] for intermittent agitation. -Continue home Remeron -Continue home Zoloft -Can use melatonin -PT/OT -Delirium precautions  AKI: Improving. Cr 1.35, from 1.49 on admit.  Cr WNL at baseline.  Likely prerenal. -S/p fluids -Monitor BMP -Encourage oral hydration  Hyperlipidemia: Chronic, stable. -[facility]  FEN/GI: Regular diet  Prophylaxis: Lovenox   Disposition:  Likely discharge home this afternoon  Subjective:  No acute events overnight.  Doing well this morning, daughter at bedside feels he is back to his baseline.  Wants to know if they are ready to go today.  He states he feels well.  Objective: Temp:  [97.5 F (36.4 C)-98.4 F (36.9 C)] 97.5 F (36.4 C) (07/25 0649) Pulse Rate:  [54-61] 54 (07/25 0649) Resp:  [13-17] 16 (07/25 0649) BP: (120-137)/(68-75) 120/69 (07/25 0649) SpO2:  [98 %] 98 % (07/25 0649) Weight:  [90.7 kg] 90.7 kg (07/24 1243) Physical Exam: General: Alert, NAD HEENT: NCAT,  MMM Cardiac: RRR Lungs: Clear bilaterally, no increased WOB  Abdomen: soft, non-tender, non-distended Msk: Moves all extremities spontaneously  Ext: Warm, dry, 2+ distal pulses, no edema  Psych: Alert.  Poor eye contact.  Answers questions appropriately with short phrases.  Laboratory: Recent Labs  Lab 03/17/20 1306  WBC 4.0  HGB 13.9  HCT 41.4  PLT 146*   Recent Labs  Lab 03/17/20 1306  NA 141  K 3.8  CL 107  CO2 27  BUN 22  CREATININE 1.49*  CALCIUM 8.8*  PROT 6.6  BILITOT 0.9  ALKPHOS 49  ALT 17  AST 24  GLUCOSE 106*    Imaging/Diagnostic Tests: CT ABDOMEN PELVIS WO CONTRAST  Result Date: 03/17/2020 CLINICAL DATA:  Confusion and possible abdominal pain. EXAM: CT ABDOMEN AND PELVIS WITHOUT CONTRAST TECHNIQUE: Multidetector CT imaging of the abdomen and pelvis was performed following the standard protocol without IV contrast. COMPARISON:  None. FINDINGS: Lower chest: Lung bases clear. Calcified plaque over the descending thoracic aorta. Hepatobiliary: Multiple liver cysts are present with the largest over the left lobe measuring 4.2 cm. Gallbladder and biliary tree are within normal. Pancreas: Normal. Spleen: Normal. Adrenals/Urinary Tract: Adrenal glands are normal. Kidneys are normal size without hydronephrosis, focal mass or nephrolithiasis. Ureters and bladder are normal. Stomach/Bowel: Stomach is normal. Prominent diverticula over the third portion of the duodenum. Small bowel is otherwise unremarkable. Minimal diverticulosis of the colon. Appendix is normal. Vascular/Lymphatic: Mild calcified plaque over the abdominal aorta which is normal in caliber. No adenopathy. Reproductive: Mild prominence of the prostate gland measuring 5.9 cm in greatest diameter. Other: Surgical sutures over the lower anterior pelvic wall. Multiple pelvic phleboliths. No free fluid or focal inflammatory change. Musculoskeletal: Mild  degenerative change of the spine with disc disease at the L4-5  level. IMPRESSION: 1.  No acute findings in the abdomen/pelvis. 2. Multiple liver cysts with the largest measuring 4.2 cm over the left lobe. 3.  Mild colonic diverticulosis. 4.  Aortic Atherosclerosis (ICD10-I70.0). 5.  Mild prostatic enlargement. Electronically Signed   By: Ryan Pope M.D.   On: 03/17/2020 14:10   CT Head Wo Contrast  Result Date: 03/17/2020 CLINICAL DATA:  Delirium, more confused than normal and more combative, agitation, history prostate cancer EXAM: CT HEAD WITHOUT CONTRAST TECHNIQUE: Contiguous axial images were obtained from the base of the skull through the vertex without intravenous contrast. Sagittal and coronal MPR images reconstructed from axial data set. COMPARISON:  01/12/2019 FINDINGS: Brain: Generalized atrophy. Normal ventricular morphology. No midline shift or mass effect. Minimal small vessel chronic ischemic changes of deep cerebral white matter. No intracranial hemorrhage, mass lesion or evidence of acute infarction. No extra-axial fluid collections. Vascular: Atherosclerotic calcification of internal carotid arteries bilaterally at skull base. No hyperdense vessels. Skull: Intact Sinuses/Orbits: Clear Other: N/A IMPRESSION: Atrophy with minimal small vessel chronic ischemic changes of deep cerebral white matter. No acute intracranial abnormalities. Electronically Signed   By: Ryan Pope M.D.   On: 03/17/2020 14:09   DG Chest Ryan 1 View  Result Date: 03/17/2020 CLINICAL DATA:  Syncope EXAM: PORTABLE CHEST 1 VIEW COMPARISON:  01/12/2019 FINDINGS: Single view supine chest radiograph. Lungs are well expanded, symmetric, and clear. No pneumothorax or pleural effusion. Cardiac size within normal limits. Pulmonary vascularity is normal. Osseous structures are age-appropriate. No acute bone abnormality. IMPRESSION: No active disease. Electronically Signed   By: Ryan Salisbury MD   On: 03/17/2020 15:04    Ryan Clan, DO 03/18/2020, 8:12 AM PGY-3, [place] Intern pager: [phone], text pages welcome

## 2020-03-18 NOTE — Progress Notes (Signed)
Occupational Therapy Evaluation and Discharge Patient Details Name: Ryan Pope MRN: [id number] DOB: May 29, 1946 Today's Date: 03/18/2020    History of Present Illness 74 yo M admitted after near syncopal episode on the commode, thought to be vasovagal ersposonce. PMH significant for but not limited to dementia, prostate cancer, and exposore to agent orange and asbestos    Clinical Impression   Pt admitted for near syncopal episode on the commode. Pt limited by cognitive deficits at baseline secondary to dementia diagnosis. Pt exhibiting cognitive and balance deficits. Prior to hospitalization, pt was living with his daughter and receiving 24/7 S/A as needed to ensure safety. Pt's daughter reports pt able to ambulate throughout the house with supervision and required min-mod assist for dressing/bathing/toileting and setup for eating/grooming at baseline. Pt/daughter report that pt is back to baseline for the most part at this time. Pt received sitting up in recliner, daughter present and able to confirm pt's PLOF and home setup. Pt required supervision-min guard for functional mobility/transfers to/from bathroom and mod verbal/visual cues for sequencing self-care tasks. Educated pt/daughter on fall prevention techniques. No skilled acute OT services needed at this time. Recommend 24/7 S/A from family at home upon discharge, no post-acute OT services necessary. Please re-consult if pt's functional status changes. OT signing off.     Follow Up Recommendations  No OT follow up;Supervision/Assistance - 24 hour    Equipment Recommendations  None recommended by OT    Recommendations for Other Services  (None)     Precautions / Restrictions Precautions Precautions: Fall Restrictions Weight Bearing Restrictions: No      Mobility Bed Mobility Overal bed mobility:  (received up in recliner)    General bed mobility comments:  (received up in recliner)  Transfers Overall transfer level:  Modified independent Equipment used: None       Balance Overall balance assessment: Mild deficits observed, not formally tested       ADL either performed or assessed with clinical judgement   ADL Overall ADL's : Needs assistance/impaired Eating/Feeding: Set up;Sitting   Grooming: Wash/dry hands;Supervision/safety;Set up;Standing Grooming Details (indicate cue type and reason): stood at sink to wash hands with min verbal/visual cues for sequencing Upper Body Bathing: Supervision/ safety   Lower Body Bathing: Min guard;Minimal assistance;Sitting/lateral leans;Sit to/from stand   Upper Body Dressing : Set up;Min guard   Lower Body Dressing: Min guard;Minimal assistance   Toilet Transfer: Loss adjuster, chartered Details (indicate cue type and reason): transferred on/off standard toilet with v/c's for sequencing and hand placement Toileting- Clothing Manipulation and Hygiene: Moderate assistance   Tub/ Banker:  (not assessed)   Functional mobility during ADLs: Min guard General ADL Comments: pt able to ambulate to/from bathroom to perform toileting and grooming tasks; min guard for safety and v/c's for sequencing tasks     Vision Baseline Vision/History: Wears glasses Wears Glasses: At all times                  Pertinent Vitals/Pain Pain Assessment: No/denies pain     Hand Dominance Right   Extremity/Trunk Assessment Upper Extremity Assessment Upper Extremity Assessment: Overall WFL for tasks assessed   Lower Extremity Assessment Lower Extremity Assessment: Overall WFL for tasks assessed   Cervical / Trunk Assessment Cervical / Trunk Assessment: Normal   Communication Communication Communication: No difficulties   Cognition Arousal/Alertness: Awake/alert Behavior During Therapy: WFL for tasks assessed/performed;Restless (fumbling with blanket) Overall Cognitive Status: History of cognitive impairments - at baseline  General  Comments  pt's daughter present at bedside confirming PLOF and home setup; daughter able to continue providing 24/7 S/A as needed for safety               Home Living Family/patient expects to be discharged to:: Private residence Living Arrangements: Children Available Help at Discharge: Family;Available 24 hours/day Type of Home: House Home Access: Stairs to enter [employer] of Steps: 3 Entrance Stairs-Rails: Right Home Layout: One level     Bathroom Shower/Tub: Other (comment) (pt prefers to sponge bathe)   Bathroom Toilet: Standard     Home Equipment: None          Prior Functioning/Environment Level of Independence: Needs assistance  Gait / Transfers Assistance Needed: ambulates with supervision and no AD ADL's / Homemaking Assistance Needed: requires setup for feeding/grooming, min-mod for dressing and toileting, mod for bathing   Comments: relient on family secondary to dementia diagnosis        OT Problem List: Decreased cognition;Decreased safety awareness;Impaired balance (sitting and/or standing)      OT Treatment/Interventions:   OT eval only   OT Goals(Current goals can be found in the care plan section) Acute Rehab OT Goals Patient Stated Goal: to return home OT Goal Formulation: With patient/family Time For Goal Achievement:  (N/A) Potential to Achieve Goals:  (N/A)   OT Frequency:   OT eval only   AM-PAC OT "6 Clicks" Daily Activity     Outcome Measure Help from another person eating meals?: A Little Help from another person taking care of personal grooming?: A Little Help from another person toileting, which includes using toliet, bedpan, or urinal?: A Lot Help from another person bathing (including washing, rinsing, drying)?: A Lot Help from another person to put on and taking off regular upper body clothing?: A Little Help from another person to put on and taking off regular lower body clothing?: A Little 6 Click Score: 16    End of Session Equipment Utilized During Treatment: Gait belt Nurse Communication: Mobility status  Activity Tolerance: Patient tolerated treatment well Patient left: in chair;with call bell/phone within reach;with family/visitor present  OT Visit Diagnosis: Unsteadiness on feet (R26.81);Other symptoms and signs involving cognitive function                Time: 0142-0159 OT Time Calculation (min): 17 min Charges:  OT General Charges $OT Visit: 1 Visit OT Evaluation $OT Eval Low Complexity: 1 Low  Michel Bickers, OTR/L [facility] [phone]   Francesca Jewett 03/18/2020, 2:25 PM

## 2020-03-18 NOTE — Progress Notes (Addendum)
Patient showed s/s of increase agitation and restlessness (pulling off tele lines, condom cath, up and down in bed and wandering around the room) ,unable to be redirected easily however pt is not combative. Dtr present at this time. Paged on call to notify regarding recent status and requests for a sleep aide (see new order). He was redirected to a recliner, he sat down and was changed to briefs as per order "this is what he wears at night and he might stop being agitated." Currently patient is asleep in the recliner. Dtr at bedside. Will cont to monitor for changes. around 0430, pt. Pulls his O2 sensor off, unable to be redirected. Dtr is aware.

## 2020-03-18 NOTE — Plan of Care (Signed)
  Problem: Education: Goal: Knowledge of General Education information will improve Description: Including pain rating scale, medication(s)/side effects and non-pharmacologic comfort measures 03/18/2020 1428 by Baldomero Lamy, RN Outcome: Adequate for Discharge 03/18/2020 0803 by Baldomero Lamy, RN Outcome: Progressing   Problem: Health Behavior/Discharge Planning: Goal: Ability to manage health-related needs will improve 03/18/2020 1428 by Baldomero Lamy, RN Outcome: Adequate for Discharge 03/18/2020 0803 by Baldomero Lamy, RN Outcome: Progressing   Problem: Clinical Measurements: Goal: Ability to maintain clinical measurements within normal limits will improve 03/18/2020 1428 by Baldomero Lamy, RN Outcome: Adequate for Discharge 03/18/2020 0803 by Baldomero Lamy, RN Outcome: Progressing Goal: Will remain free from infection 03/18/2020 1428 by Baldomero Lamy, RN Outcome: Adequate for Discharge 03/18/2020 0803 by Baldomero Lamy, RN Outcome: Progressing Goal: Diagnostic test results will improve 03/18/2020 1428 by Baldomero Lamy, RN Outcome: Adequate for Discharge 03/18/2020 0803 by Baldomero Lamy, RN Outcome: Progressing Goal: Respiratory complications will improve 03/18/2020 1428 by Baldomero Lamy, RN Outcome: Adequate for Discharge 03/18/2020 0803 by Baldomero Lamy, RN Outcome: Progressing Goal: Cardiovascular complication will be avoided 03/18/2020 1428 by Baldomero Lamy, RN Outcome: Adequate for Discharge 03/18/2020 0803 by Baldomero Lamy, RN Outcome: Progressing   Problem: Activity: Goal: Risk for activity intolerance will decrease 03/18/2020 1428 by Baldomero Lamy, RN Outcome: Adequate for Discharge 03/18/2020 0803 by Baldomero Lamy, RN Outcome: Progressing   Problem: Nutrition: Goal: Adequate nutrition will be maintained 03/18/2020 1428 by Baldomero Lamy, RN Outcome: Adequate for Discharge 03/18/2020 0803 by Baldomero Lamy, RN Outcome: Progressing   Problem: Coping: Goal: Level of anxiety  will decrease 03/18/2020 1428 by Baldomero Lamy, RN Outcome: Adequate for Discharge 03/18/2020 0803 by Baldomero Lamy, RN Outcome: Progressing   Problem: Elimination: Goal: Will not experience complications related to bowel motility 03/18/2020 1428 by Baldomero Lamy, RN Outcome: Adequate for Discharge 03/18/2020 0803 by Baldomero Lamy, RN Outcome: Progressing Goal: Will not experience complications related to urinary retention 03/18/2020 1428 by Baldomero Lamy, RN Outcome: Adequate for Discharge 03/18/2020 0803 by Baldomero Lamy, RN Outcome: Progressing   Problem: Pain Managment: Goal: General experience of comfort will improve 03/18/2020 1428 by Baldomero Lamy, RN Outcome: Adequate for Discharge 03/18/2020 0803 by Baldomero Lamy, RN Outcome: Progressing   Problem: Safety: Goal: Ability to remain free from injury will improve 03/18/2020 1428 by Baldomero Lamy, RN Outcome: Adequate for Discharge 03/18/2020 0803 by Baldomero Lamy, RN Outcome: Progressing   Problem: Skin Integrity: Goal: Risk for impaired skin integrity will decrease 03/18/2020 1428 by Baldomero Lamy, RN Outcome: Adequate for Discharge 03/18/2020 0803 by Baldomero Lamy, RN Outcome: Progressing

## 2020-03-20 LAB — GLUCOSE, CAPILLARY: Glucose-Capillary: 95 mg/dL (ref 70–99)

## 2020-03-31 NOTE — Discharge Summary (Signed)
[facility] Discharge Summary  Patient name: Ryan Pope Medical record number: [id number] Date of birth: [birth year] Age: 74 y.o. Gender: male Date of Admission: 03/17/2020  Date of Discharge: 7/25 Admitting Physician: Lind Covert, MD  Primary Care Provider: [facility] Consultants: None   Indication for Hospitalization: Near syncope  Discharge Diagnoses/Problem List:  Suspected vasovagal presyncope Dementia, lives with daughter full-time AKI, improved Hyperlipidemia  Disposition: Home with daughter  Discharge Condition: Stable  Discharge Exam:  General: Alert, NAD HEENT: NCAT Cardiac: RRR Lungs: Clear bilaterally, no increased WOB  Abdomen: soft, non-tender, non-distended Msk: Moves all extremities spontaneously  Ext: Warm, dry, 2+ distal pulses, no edema bilaterally  Psych: Alert.  Poor eye contact.  Answers questions appropriately with short phrases.  Brief Hospital Course:  Mr. Ambrocio is a 74 year old gentleman with a history of dementia and hyperlipidemia who was admitted for near syncope episode.  Daughter reported after urination/bowel movement episode the morning of admission that he appeared shaky and slumped over. Reportedly never lost consciousness.  Felt and acted at baseline prior to episode.  CT head without acute intracranial abnormalities.  EKG NSR and no arrhythmia on telemetry.  Troponin trended flat (12>5).  Electrolytes, glucose, U/A, and ammonia WNL. No leukocytosis. TSH slightly elevated at 5.4.  CR 1.4 on admit, baseline WNL.  Orthostatic vitals unremarkable.  Felt likely secondary to vasovagal presyncope in the setting of micturition/defecation and poor fluid intake. No concern for seizure like activity. Did not obtain echocardiogram for evaluation due to no concern for contributing heart failure or valvular disease through physical evaluation. Received IV fluids during stay. At admission and for the remainder  of hospitalization, he returned back to baseline without any further episodes.  On discharge, he was hemodynamically stable and well-appearing.  Issues for Follow Up:  1. Monitor for recurrence of episodes.  Recommended daughter frequently prompt patient for fluid intake to maintain hydration status.  2. TSH 5.4.  Please consider repeating TSH with T4. 3. Experience mild AKI during stay due to poor fluid intake, CR 1.35 on discharge.  Obtain BMP. 4. Recommend follow-up with established [facility] psychiatrist to adjust psychiatric meds as appropriate (appear to be starting to titrate off Zoloft per daughter).   Significant Procedures: None  Significant Labs and Imaging:  No results for input(s): WBC, HGB, HCT, PLT in the last 168 hours. No results for input(s): NA, K, CL, CO2, GLUCOSE, BUN, CREATININE, CALCIUM, MG, PHOS, ALKPHOS, AST, ALT, ALBUMIN, PROTEIN in the last 168 hours.  Invalid input(s): TBILI  CT ABDOMEN PELVIS WO CONTRAST  Result Date: 03/17/2020 CLINICAL DATA:  Confusion and possible abdominal pain. EXAM: CT ABDOMEN AND PELVIS WITHOUT CONTRAST TECHNIQUE: Multidetector CT imaging of the abdomen and pelvis was performed following the standard protocol without IV contrast. COMPARISON:  None. FINDINGS: Lower chest: Lung bases clear. Calcified plaque over the descending thoracic aorta. Hepatobiliary: Multiple liver cysts are present with the largest over the left lobe measuring 4.2 cm. Gallbladder and biliary tree are within normal. Pancreas: Normal. Spleen: Normal. Adrenals/Urinary Tract: Adrenal glands are normal. Kidneys are normal size without hydronephrosis, focal mass or nephrolithiasis. Ureters and bladder are normal. Stomach/Bowel: Stomach is normal. Prominent diverticula over the third portion of the duodenum. Small bowel is otherwise unremarkable. Minimal diverticulosis of the colon. Appendix is normal. Vascular/Lymphatic: Mild calcified plaque over the abdominal aorta which is normal in  caliber. No adenopathy. Reproductive: Mild prominence of the prostate gland measuring 5.9 cm in greatest diameter. Other:  Surgical sutures over the lower anterior pelvic wall. Multiple pelvic phleboliths. No free fluid or focal inflammatory change. Musculoskeletal: Mild degenerative change of the spine with disc disease at the L4-5 level. IMPRESSION: 1.  No acute findings in the abdomen/pelvis. 2. Multiple liver cysts with the largest measuring 4.2 cm over the left lobe. 3.  Mild colonic diverticulosis. 4.  Aortic Atherosclerosis (ICD10-I70.0). 5.  Mild prostatic enlargement. Electronically Signed   By: Marin Olp M.D.   On: 03/17/2020 14:10   CT Head Wo Contrast  Result Date: 03/17/2020 CLINICAL DATA:  Delirium, more confused than normal and more combative, agitation, history prostate cancer EXAM: CT HEAD WITHOUT CONTRAST TECHNIQUE: Contiguous axial images were obtained from the base of the skull through the vertex without intravenous contrast. Sagittal and coronal MPR images reconstructed from axial data set. COMPARISON:  01/12/2019 FINDINGS: Brain: Generalized atrophy. Normal ventricular morphology. No midline shift or mass effect. Minimal small vessel chronic ischemic changes of deep cerebral white matter. No intracranial hemorrhage, mass lesion or evidence of acute infarction. No extra-axial fluid collections. Vascular: Atherosclerotic calcification of internal carotid arteries bilaterally at skull base. No hyperdense vessels. Skull: Intact Sinuses/Orbits: Clear Other: N/A IMPRESSION: Atrophy with minimal small vessel chronic ischemic changes of deep cerebral white matter. No acute intracranial abnormalities. Electronically Signed   By: Lavonia Dana M.D.   On: 03/17/2020 14:09   DG Chest Port 1 View  Result Date: 03/17/2020 CLINICAL DATA:  Syncope EXAM: PORTABLE CHEST 1 VIEW COMPARISON:  01/12/2019 FINDINGS: Single view supine chest radiograph. Lungs are well expanded, symmetric, and clear. No  pneumothorax or pleural effusion. Cardiac size within normal limits. Pulmonary vascularity is normal. Osseous structures are age-appropriate. No acute bone abnormality. IMPRESSION: No active disease. Electronically Signed   By: Fidela Salisbury MD   On: 03/17/2020 15:04   Results/Tests Pending at Time of Discharge: None  Discharge Medications:  Allergies as of 03/18/2020      Reactions   Benzodiazepines Other (See Comments)   Once the effect of the medication wears off, he becomes worse off than he was initially- "combative and beyond agitated"   Other Other (See Comments)   "Anti-psychotics" = Once the effect of the medication wears off, he becomes worse off than he was initially- "combative and beyond agitated"   Zoloft [sertraline Hcl] Other (See Comments)   Patient is tolerating in 2021 (??)      Medication List    STOP taking these medications   calcium carbonate 500 MG chewable tablet Commonly known as: TUMS - dosed in mg elemental calcium     TAKE these medications   ascorbic acid 500 MG tablet Commonly known as: VITAMIN C Take 500-1,000 mg by mouth daily with breakfast.   COCONUT OIL PO Take 2 Scoops by mouth See admin instructions. Mix 2 scoopsful of powder into a smoothie and drink every morning   KELP PO Take 1 capsule by mouth daily with breakfast.   LECITHIN PO Take 1 capsule by mouth daily with breakfast.   mirtazapine 15 MG tablet Commonly known as: REMERON Take 15 mg by mouth at bedtime.   Saw Palmetto Caps Take 1 capsule by mouth daily with breakfast.   sertraline 50 MG tablet Commonly known as: ZOLOFT Take 1 tablet (50 mg total) by mouth daily. What changed:   medication strength  how much to take  when to take this  additional instructions   VITAMIN E PO Take 1 capsule by mouth daily with breakfast.  Discharge Instructions: Please refer to Patient Instructions section of EMR for full details.  Patient was counseled important signs and  symptoms that should prompt return to medical care, changes in medications, dietary instructions, activity restrictions, and follow up appointments.   Follow-Up Appointments:  Follow-up [facility]. Schedule an appointment as soon as possible for a visit.   Specialty: General Practice Why: For hospital follow-up. Contact information: [place] [id number] [place], [place], DO 03/31/2020, 11:34 PM PGY-3, [place]

## 2020-06-02 IMAGING — CR CHEST - 2 VIEW
4 series · 4 of 4 positions shown · non-contrast
Comparison: October 13, 2018

CLINICAL DATA: Altered mental status from [HOSPITAL].

EXAM:
CHEST - 2 VIEW

[w chest lat (1 of 2)]
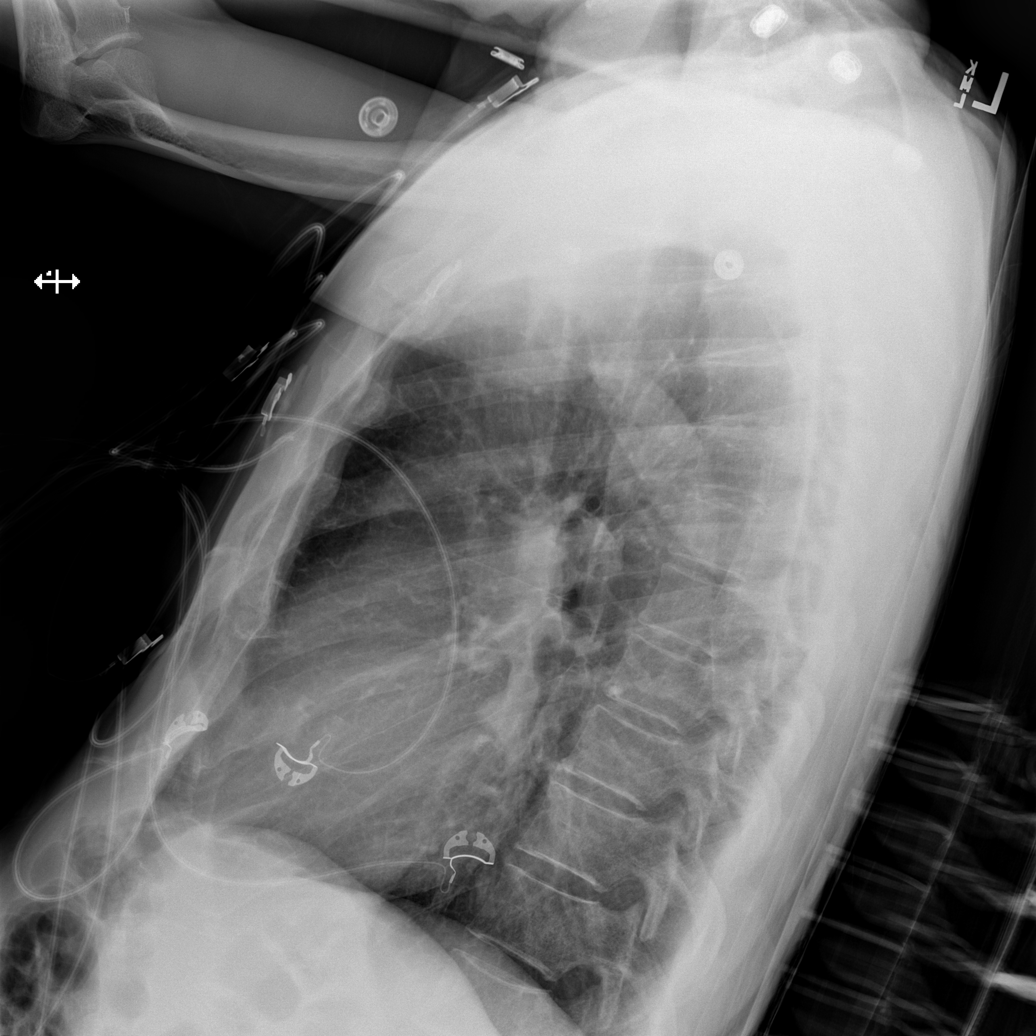

[w chest lat (2 of 2)]
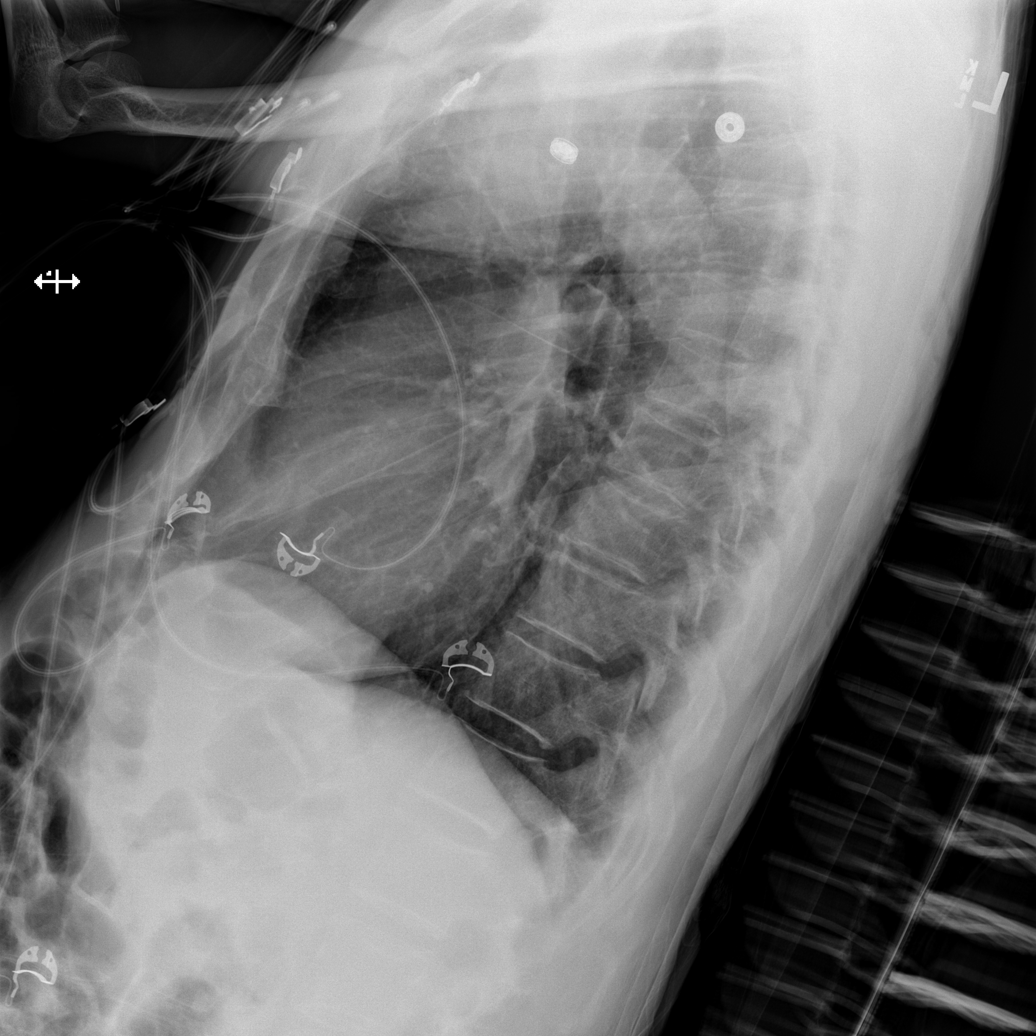

[x chest ap (1 of 2)]
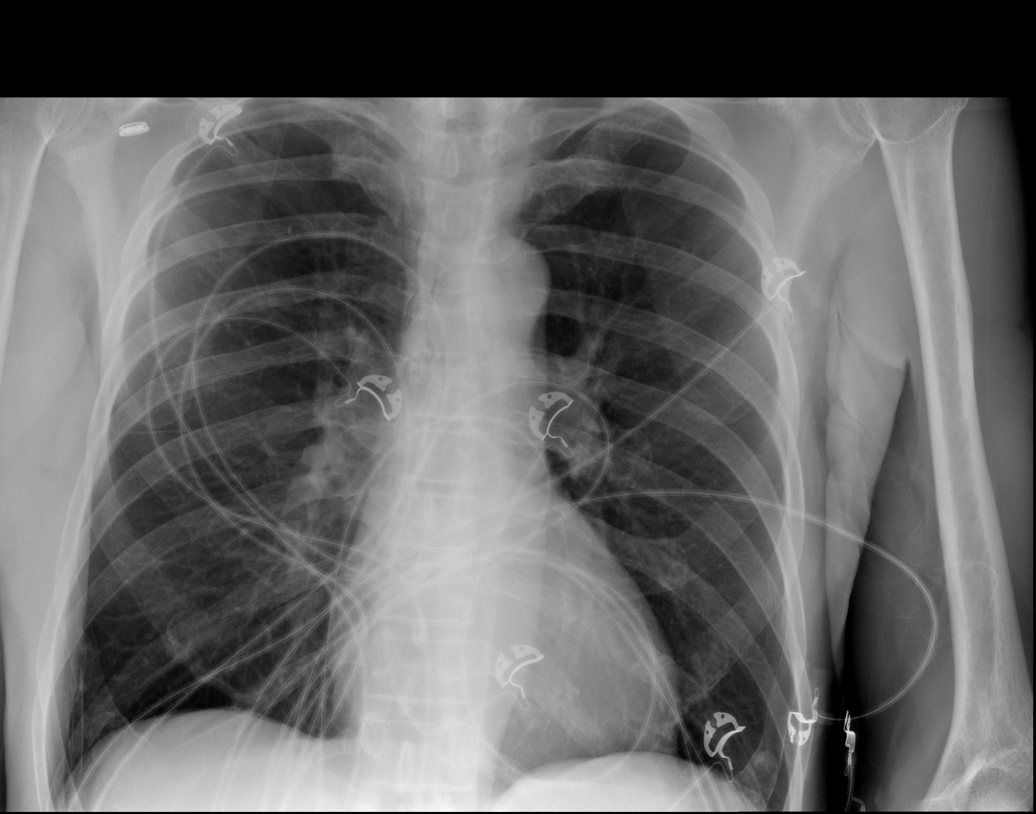

[x chest ap (2 of 2)]
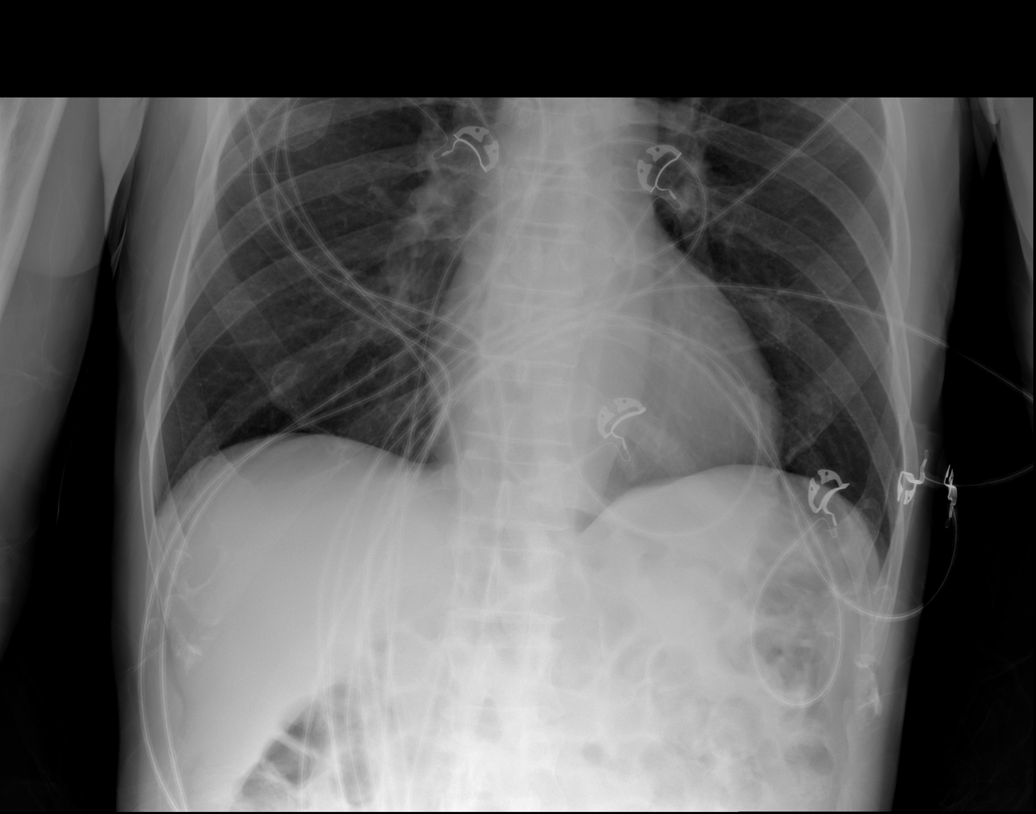

[4 of 4 positions shown; findings below may reference images not displayed]

FINDINGS: The heart size and mediastinal contours are within normal limits.
Both lungs are clear. The visualized skeletal structures are
unremarkable.
IMPRESSION: No active cardiopulmonary disease.

## 2020-06-02 IMAGING — CT CT HEAD WITHOUT CONTRAST
3 series · 15 of 47 positions shown, 18 images · non-contrast
Comparison: None.

CLINICAL DATA: Mental status changes. Altered level of
consciousness.

EXAM:
CT HEAD WITHOUT CONTRAST
TECHNIQUE: Contiguous axial images were obtained from the base of the skull
through the vertex without intravenous contrast.

[Series 2: head wo · axial · 0.47mm/px · z∈[-137,-7]mm · 9 of 32 slices shown, 12 images]
[im 3/32  brain]
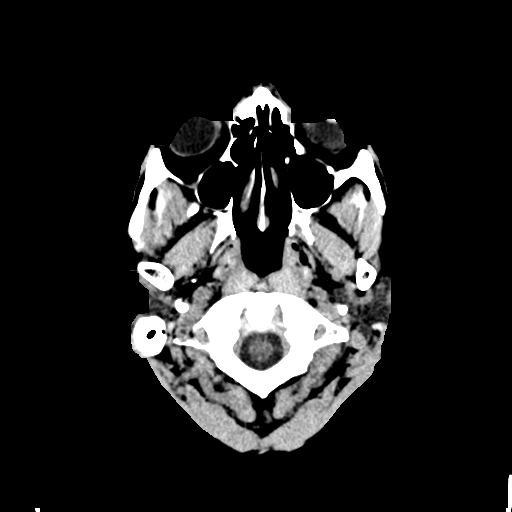
[im 3/32  bone]
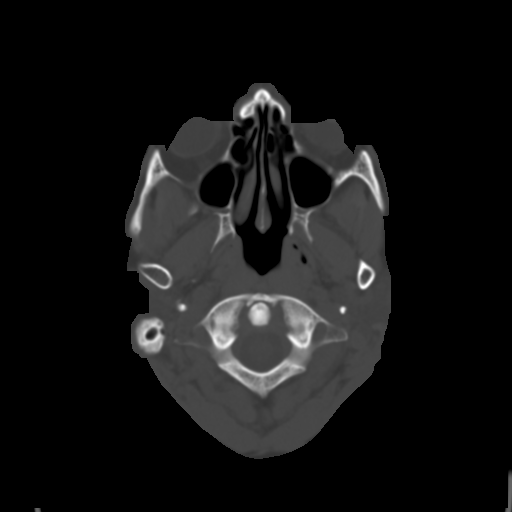
[im 6/32  brain]
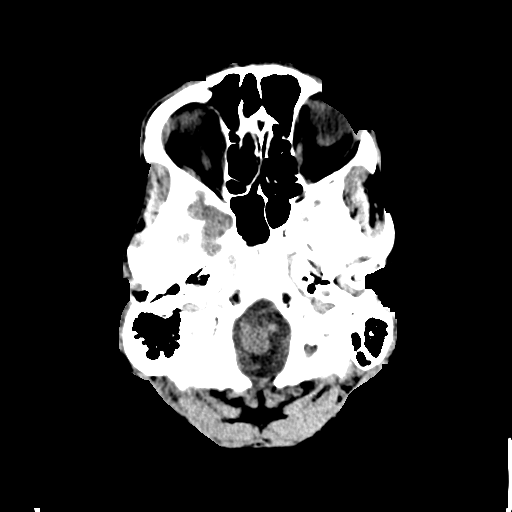
[im 9/32  brain]
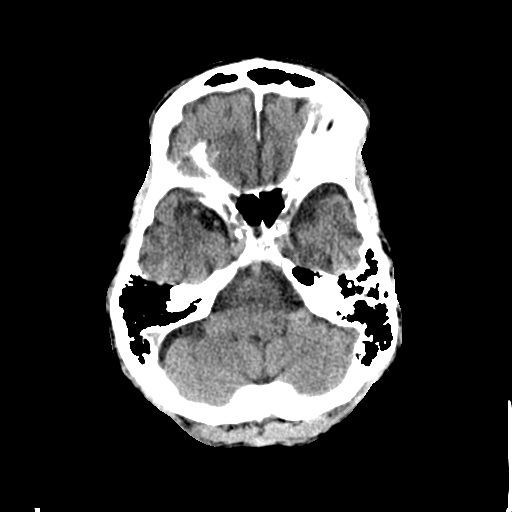
[im 12/32  brain]
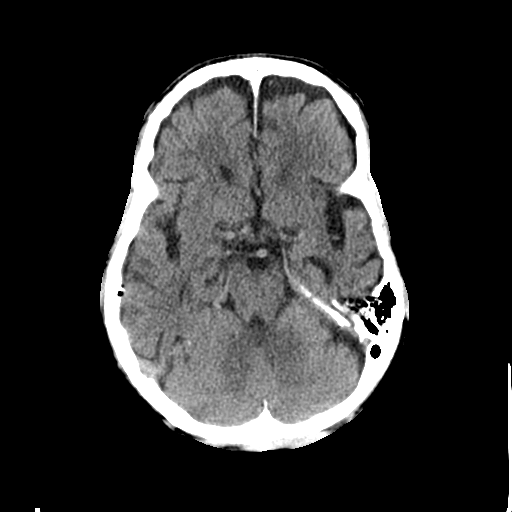
[im 17/32  brain]
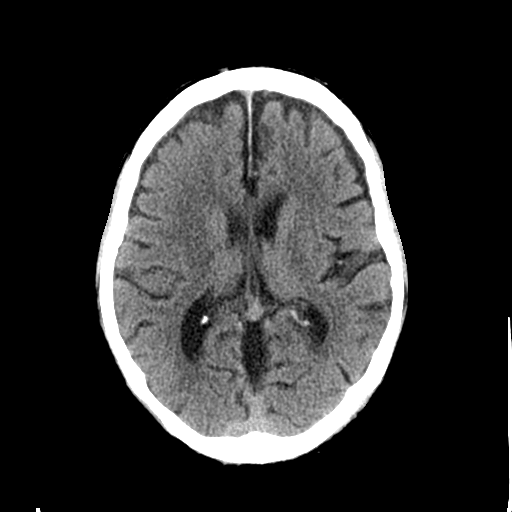
[im 17/32  bone]
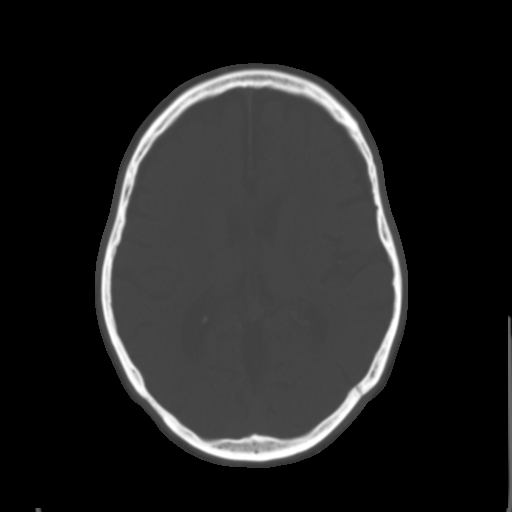
[im 20/32  brain]
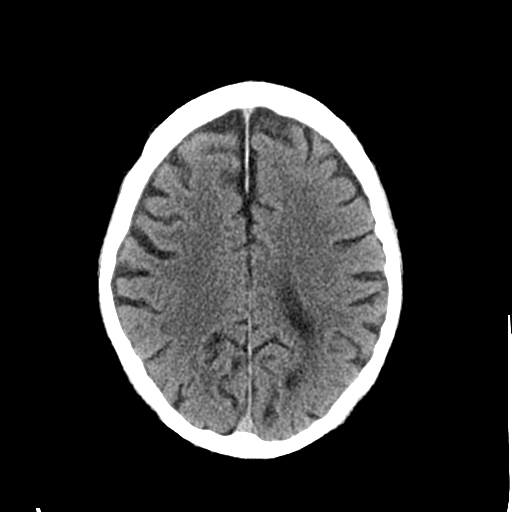
[im 23/32  brain]
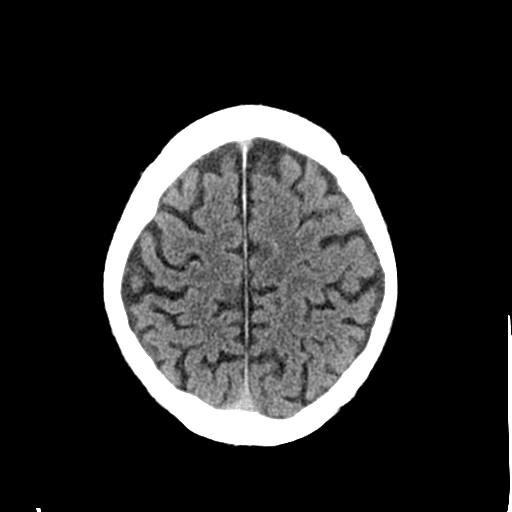
[im 26/32  brain]
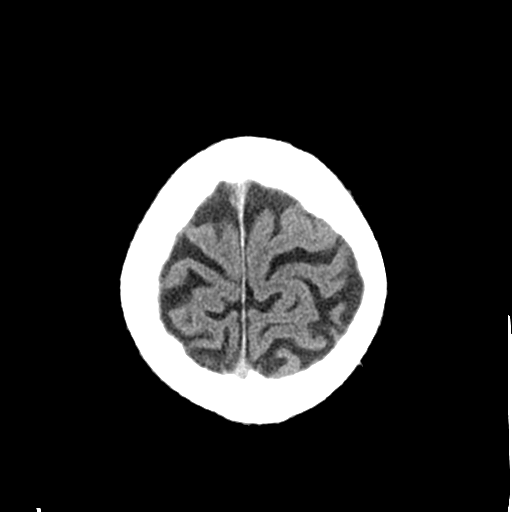
[im 29/32  brain]
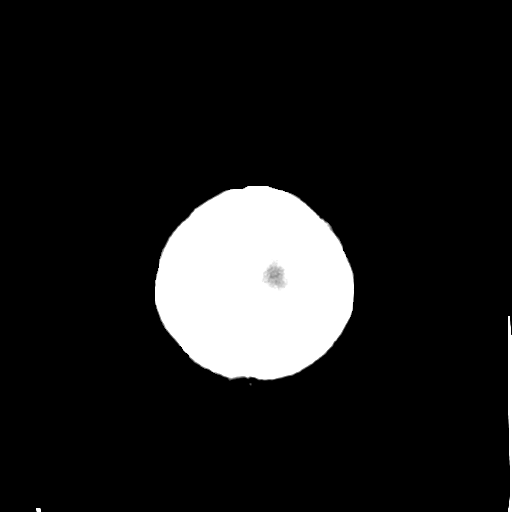
[im 29/32  bone]
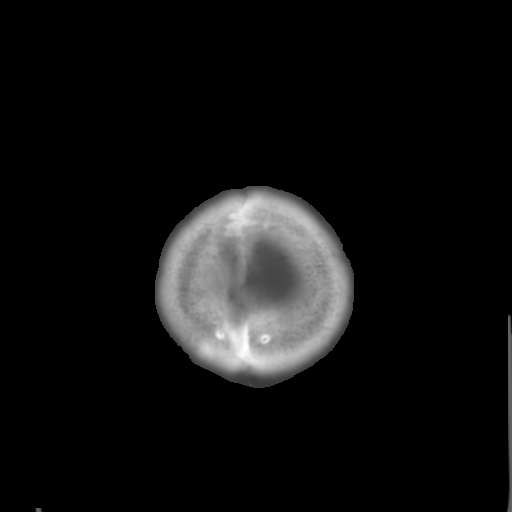

[Series 4: coronal soft tissue · coronal · 0.34mm/px · 3 of 65 slices shown]
[im 22/65  brain]
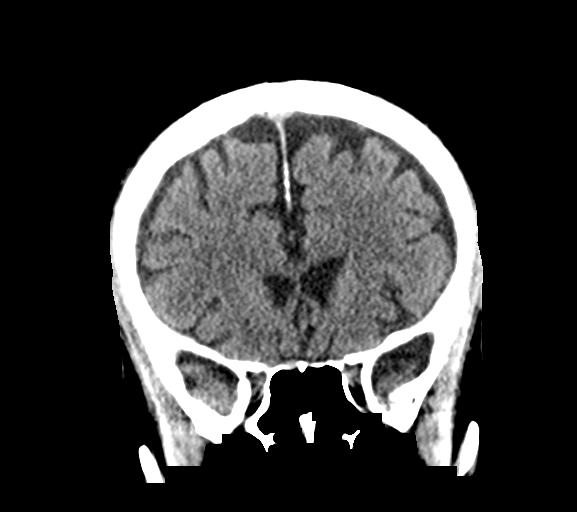
[im 29/65  brain]
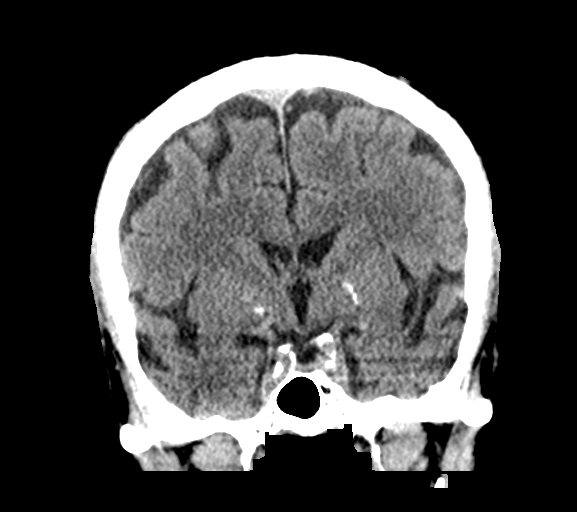
[im 36/65  brain]
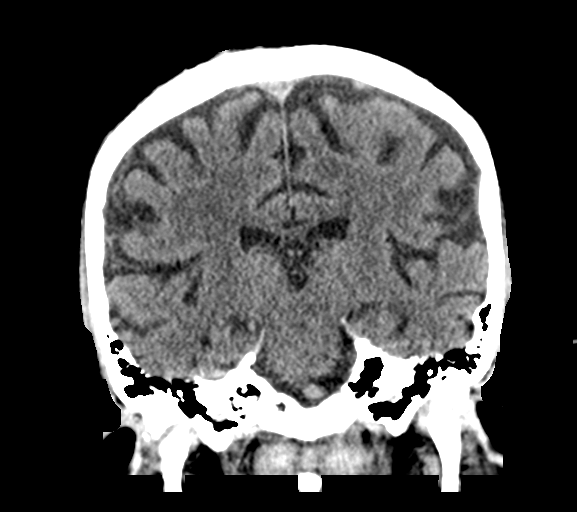

[Series 5: sagittal soft tissue · sagittal · 0.33mm/px · 3 of 50 slices shown]
[im 17/50  brain]
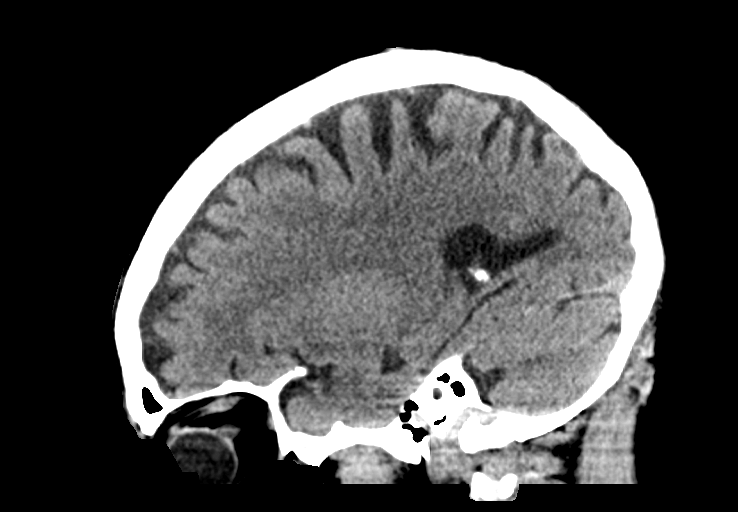
[im 25/50  brain]
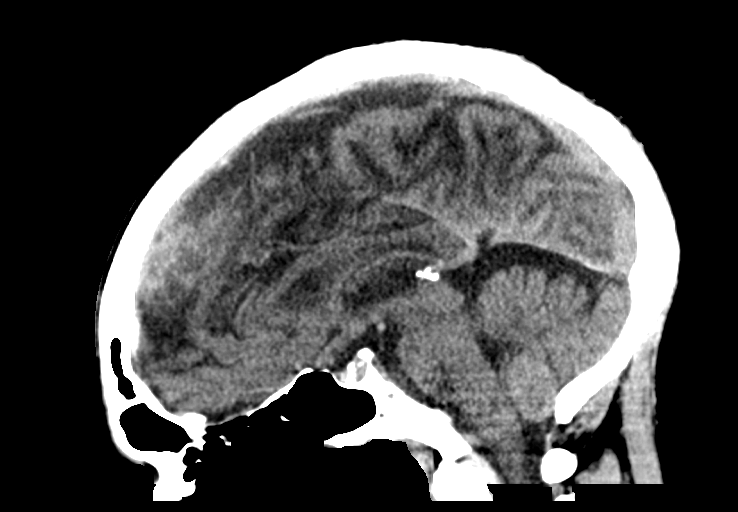
[im 33/50  brain]
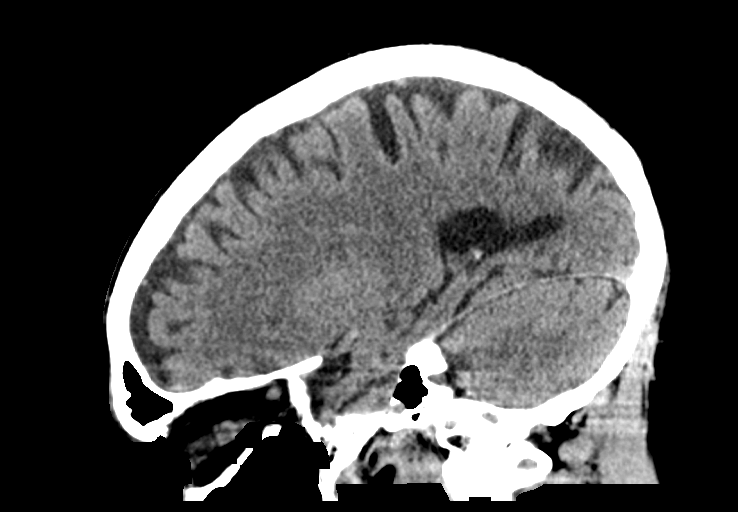

[15 of 47 positions shown; findings below may reference images not displayed]

FINDINGS: Brain: There is no evidence for acute hemorrhage, hydrocephalus,
mass lesion, or abnormal extra-axial fluid collection. No definite
CT evidence for acute infarction. Diffuse loss of parenchymal volume
is consistent with atrophy. Patchy low attenuation in the deep
hemispheric and periventricular white matter is nonspecific, but
likely reflects chronic microvascular ischemic demyelination.

Vascular: No hyperdense vessel or unexpected calcification.

Skull: No evidence for fracture. No worrisome lytic or sclerotic
lesion.

Sinuses/Orbits: The visualized paranasal sinuses and mastoid air
cells are clear. Visualized portions of the globes and intraorbital
fat are unremarkable.

Other: None.
IMPRESSION: 1. No acute intracranial abnormality.
2. Atrophy with chronic small-vessel white matter ischemic disease.

## 2021-08-06 IMAGING — DX DG CHEST 1V PORT
1 series · 1 of 1 positions shown · non-contrast
Comparison: 01/12/2019

CLINICAL DATA: Syncope

EXAM:
PORTABLE CHEST 1 VIEW

[chest ap]
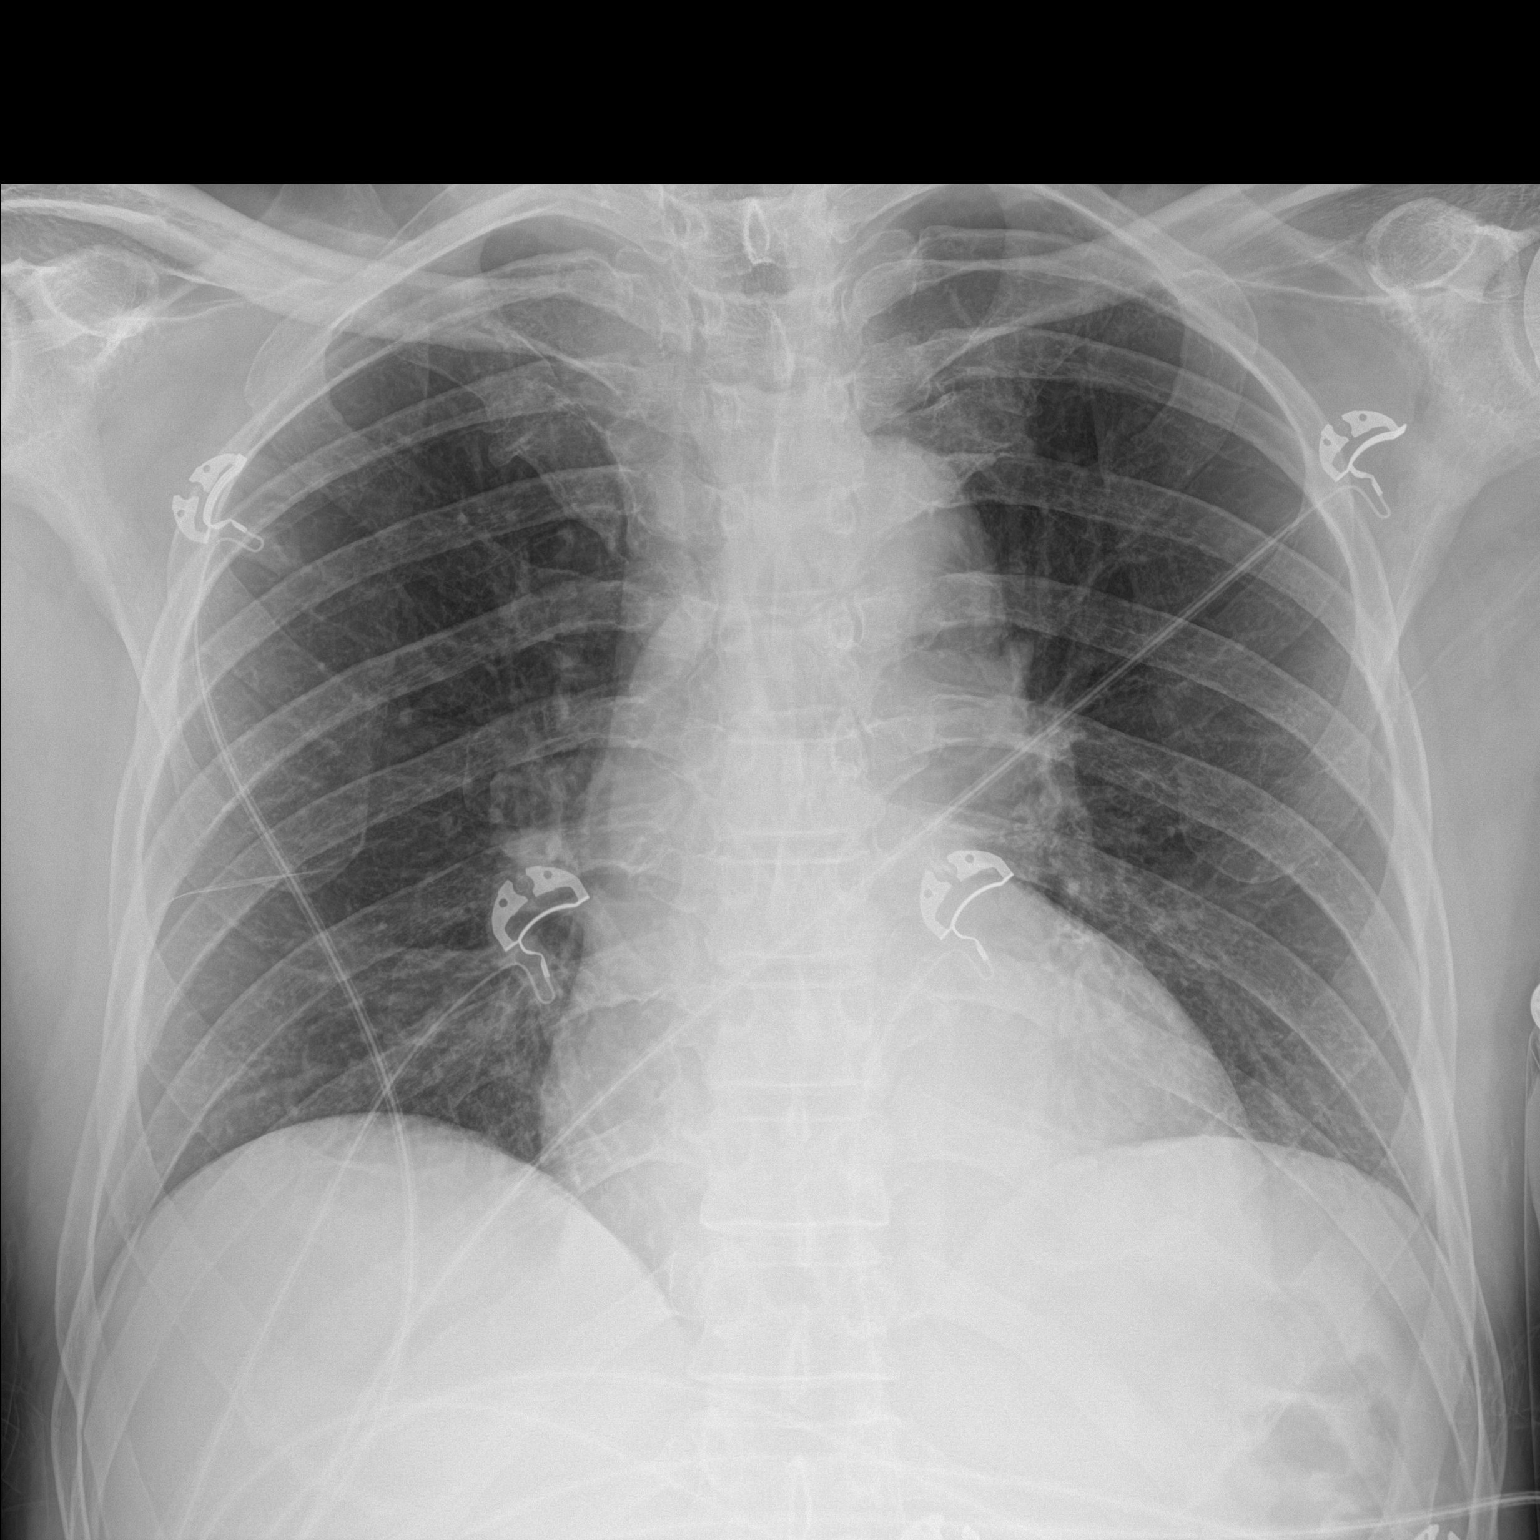

[1 of 1 positions shown; findings below may reference images not displayed]

FINDINGS: Single view supine chest radiograph. Lungs are well expanded,
symmetric, and clear. No pneumothorax or pleural effusion. Cardiac
size within normal limits. Pulmonary vascularity is normal. Osseous
structures are age-appropriate. No acute bone abnormality.
IMPRESSION: No active disease.
